# Patient Record
Sex: Female | Born: 1960 | Race: White | Hispanic: No | Marital: Married | Smoking: Former smoker
Health system: Southern US, Community
[De-identification: ages and names within clinical notes are randomized; demographics above are authoritative.]

## PROBLEM LIST (undated history)

## (undated) DIAGNOSIS — J449 Chronic obstructive pulmonary disease, unspecified: Secondary | ICD-10-CM

---

## 1999-09-28 ENCOUNTER — Other Ambulatory Visit: Admission: RE | Admit: 1999-09-28 | Discharge: 1999-09-28 | Payer: Self-pay | Admitting: Obstetrics and Gynecology

## 2001-10-16 ENCOUNTER — Ambulatory Visit (HOSPITAL_COMMUNITY): Admission: RE | Admit: 2001-10-16 | Discharge: 2001-10-16 | Payer: Self-pay | Admitting: Obstetrics and Gynecology

## 2001-10-16 ENCOUNTER — Encounter: Payer: Self-pay | Admitting: Obstetrics and Gynecology

## 2001-11-05 ENCOUNTER — Other Ambulatory Visit: Admission: RE | Admit: 2001-11-05 | Discharge: 2001-11-05 | Payer: Self-pay | Admitting: Obstetrics and Gynecology

## 2002-10-26 ENCOUNTER — Encounter: Payer: Self-pay | Admitting: Obstetrics and Gynecology

## 2002-10-26 ENCOUNTER — Ambulatory Visit (HOSPITAL_COMMUNITY): Admission: RE | Admit: 2002-10-26 | Discharge: 2002-10-26 | Payer: Self-pay | Admitting: Obstetrics and Gynecology

## 2002-12-21 ENCOUNTER — Other Ambulatory Visit: Admission: RE | Admit: 2002-12-21 | Discharge: 2002-12-21 | Payer: Self-pay | Admitting: Obstetrics and Gynecology

## 2003-08-18 ENCOUNTER — Ambulatory Visit (HOSPITAL_BASED_OUTPATIENT_CLINIC_OR_DEPARTMENT_OTHER): Admission: RE | Admit: 2003-08-18 | Discharge: 2003-08-18 | Payer: Self-pay | Admitting: Orthopedic Surgery

## 2004-02-02 ENCOUNTER — Other Ambulatory Visit: Admission: RE | Admit: 2004-02-02 | Discharge: 2004-02-02 | Payer: Self-pay | Admitting: Obstetrics and Gynecology

## 2004-06-08 ENCOUNTER — Other Ambulatory Visit: Payer: Self-pay

## 2011-01-06 ENCOUNTER — Encounter: Payer: Self-pay | Admitting: Obstetrics and Gynecology

## 2011-01-07 ENCOUNTER — Encounter: Payer: Self-pay | Admitting: Obstetrics and Gynecology

## 2011-05-04 NOTE — Op Note (Signed)
NAME:  Vickie Davis, Vickie Davis                            ACCOUNT NO.:  [id number]   MEDICAL RECORD NO.:  [id number]                   PATIENT TYPE:  AMB   LOCATION:  DSC                                  FACILITY:  [facility]   PHYSICIAN:  Harvie Junior, M.D.                DATE OF BIRTH:  01-11-1961   DATE OF PROCEDURE:  08/18/2003  DATE OF DISCHARGE:  08/18/2003                                 OPERATIVE REPORT   PREOPERATIVE DIAGNOSIS:  1. Carpal tunnel syndrome, right.  2. Volar radial ganglion cyst, right.   POSTOPERATIVE DIAGNOSIS:  1. Carpal tunnel syndrome, right.  2. Volar radial ganglion cyst, right.   PROCEDURE:  1. Right carpal tunnel release.  2. Excision of volar ganglion cyst, right.   SURGEON:  Harvie Junior, M.D.   ASSISTANT:  Marshia Ly, P.A.   ANESTHESIA:  Bier block.   INDICATIONS FOR PROCEDURE:  This is a 50 year old female with a long history  of having carpal tunnel symptoms as well as having a volar ganglion cyst.  We ultimately treated her conservatively, but failed.  Because of failure of  conservative care, she was taken to the operating room for excision of cyst  and release of carpal tunnel.   DESCRIPTION OF PROCEDURE:  The patient was taken to the operating room and  after adequate anesthesia was obtained with general anesthesia with a  forearm-based IV regional, the patient was placed upon the operating table.  The right arm was prepped and draped in the usual sterile fashion.  Following this, a curved incision was made over the volar aspect of the  wrist in the area of her ganglion cyst.  Subcutaneous tissue was dissected  down to the level of the cyst. The cyst was clearly identified on all sides  and dissected down to its origination at the volar wrist capsule in the  scapholunate interval.  The cyst was debrided from the volar wrist capsule  at this point.  The wound was copiously irrigated and suctioned dry, and  closed with interrupted nylon  sutures.  Following this, attention was turned  to a separate incision to carpal tunnel where a curved incision just ulnar  to the midline wrist crease.  Subcutaneous tissue was dissected down to the  level where the volar carpal ligament was clearly identified and divided  gently.  A Freer elevator was used to protect the median nerve below and the  carpal tunnel was released both proximally and distally from this point.  A  gloved finger was placed in the wound proximally and distally, the median  nerve was identified as well as the motor branch and found to be freed up.  At this point the wound was  copiously irrigated and suctioned dry. Closed with interrupted nylon  sutures.  A sterile compressive dressing was applied as well as volar wrist  plaster.  The patient was  taken to the recovery room where she was noted to  be satisfactory condition.  Estimated blood loss for the procedure was none.                                               Harvie Junior, M.D.    Ranae Plumber  D:  11/23/2003  T:  11/23/2003  Job:  [id number]

## 2012-01-31 ENCOUNTER — Other Ambulatory Visit: Payer: Self-pay | Admitting: Obstetrics and Gynecology

## 2012-01-31 DIAGNOSIS — Z1231 Encounter for screening mammogram for malignant neoplasm of breast: Secondary | ICD-10-CM

## 2012-02-19 ENCOUNTER — Ambulatory Visit
Admission: RE | Admit: 2012-02-19 | Discharge: 2012-02-19 | Disposition: A | Discharge status: Home / Self Care | Payer: BC Managed Care – PPO | Source: Ambulatory Visit | Attending: Obstetrics and Gynecology | Admitting: Obstetrics and Gynecology

## 2012-02-19 DIAGNOSIS — Z1231 Encounter for screening mammogram for malignant neoplasm of breast: Secondary | ICD-10-CM

## 2012-03-10 DIAGNOSIS — Z9109 Other allergy status, other than to drugs and biological substances: Secondary | ICD-10-CM | POA: Insufficient documentation

## 2012-03-22 ENCOUNTER — Inpatient Hospital Stay: Payer: Self-pay | Admitting: *Deleted

## 2012-03-22 LAB — COMPREHENSIVE METABOLIC PANEL
Albumin: 3.9 g/dL (ref 3.4–5.0)
Alkaline Phosphatase: 99 U/L (ref 50–136)
Anion Gap: 8 (ref 7–16)
BUN: 12 mg/dL (ref 7–18)
Bilirubin,Total: 0.4 mg/dL (ref 0.2–1.0)
Calcium, Total: 8.6 mg/dL (ref 8.5–10.1)
Chloride: 106 mmol/L (ref 98–107)
Co2: 26 mmol/L (ref 21–32)
Creatinine: 0.81 mg/dL (ref 0.60–1.30)
EGFR (African American): >60
EGFR (Non-African Amer.): >60
Glucose: 157 mg/dL — ABNORMAL HIGH (ref 65–99)
Osmolality: 282 (ref 275–301)
Potassium: 3.6 mmol/L (ref 3.5–5.1)
SGOT(AST): 33 U/L (ref 15–37)
SGPT (ALT): 30 U/L
Sodium: 140 mmol/L (ref 136–145)
Total Protein: 7.4 g/dL (ref 6.4–8.2)

## 2012-03-22 LAB — CBC
HCT: 39.4 % (ref 35.0–47.0)
HGB: 13.7 g/dL (ref 12.0–16.0)
MCH: 32.7 pg (ref 26.0–34.0)
MCV: 94 fL (ref 80–100)
Platelet: 181 10*3/uL (ref 150–440)
RBC: 4.2 10*6/uL (ref 3.80–5.20)

## 2012-03-25 LAB — HEMOGLOBIN A1C

## 2012-03-27 LAB — CULTURE, BLOOD (SINGLE)

## 2012-09-01 DIAGNOSIS — J441 Chronic obstructive pulmonary disease with (acute) exacerbation: Secondary | ICD-10-CM | POA: Insufficient documentation

## 2012-09-01 DIAGNOSIS — F17200 Nicotine dependence, unspecified, uncomplicated: Secondary | ICD-10-CM | POA: Insufficient documentation

## 2013-06-20 IMAGING — CR DG CHEST 1V PORT
1 series · 1 of 1 positions shown · non-contrast
Comparison: none

REASON FOR EXAM: SOB
COMMENTS:

PROCEDURE:     DXR - DXR PORTABLE CHEST SINGLE VIEW  - March 21, 2012 [DATE]
RESULT:     Comparison: None.

[portable]
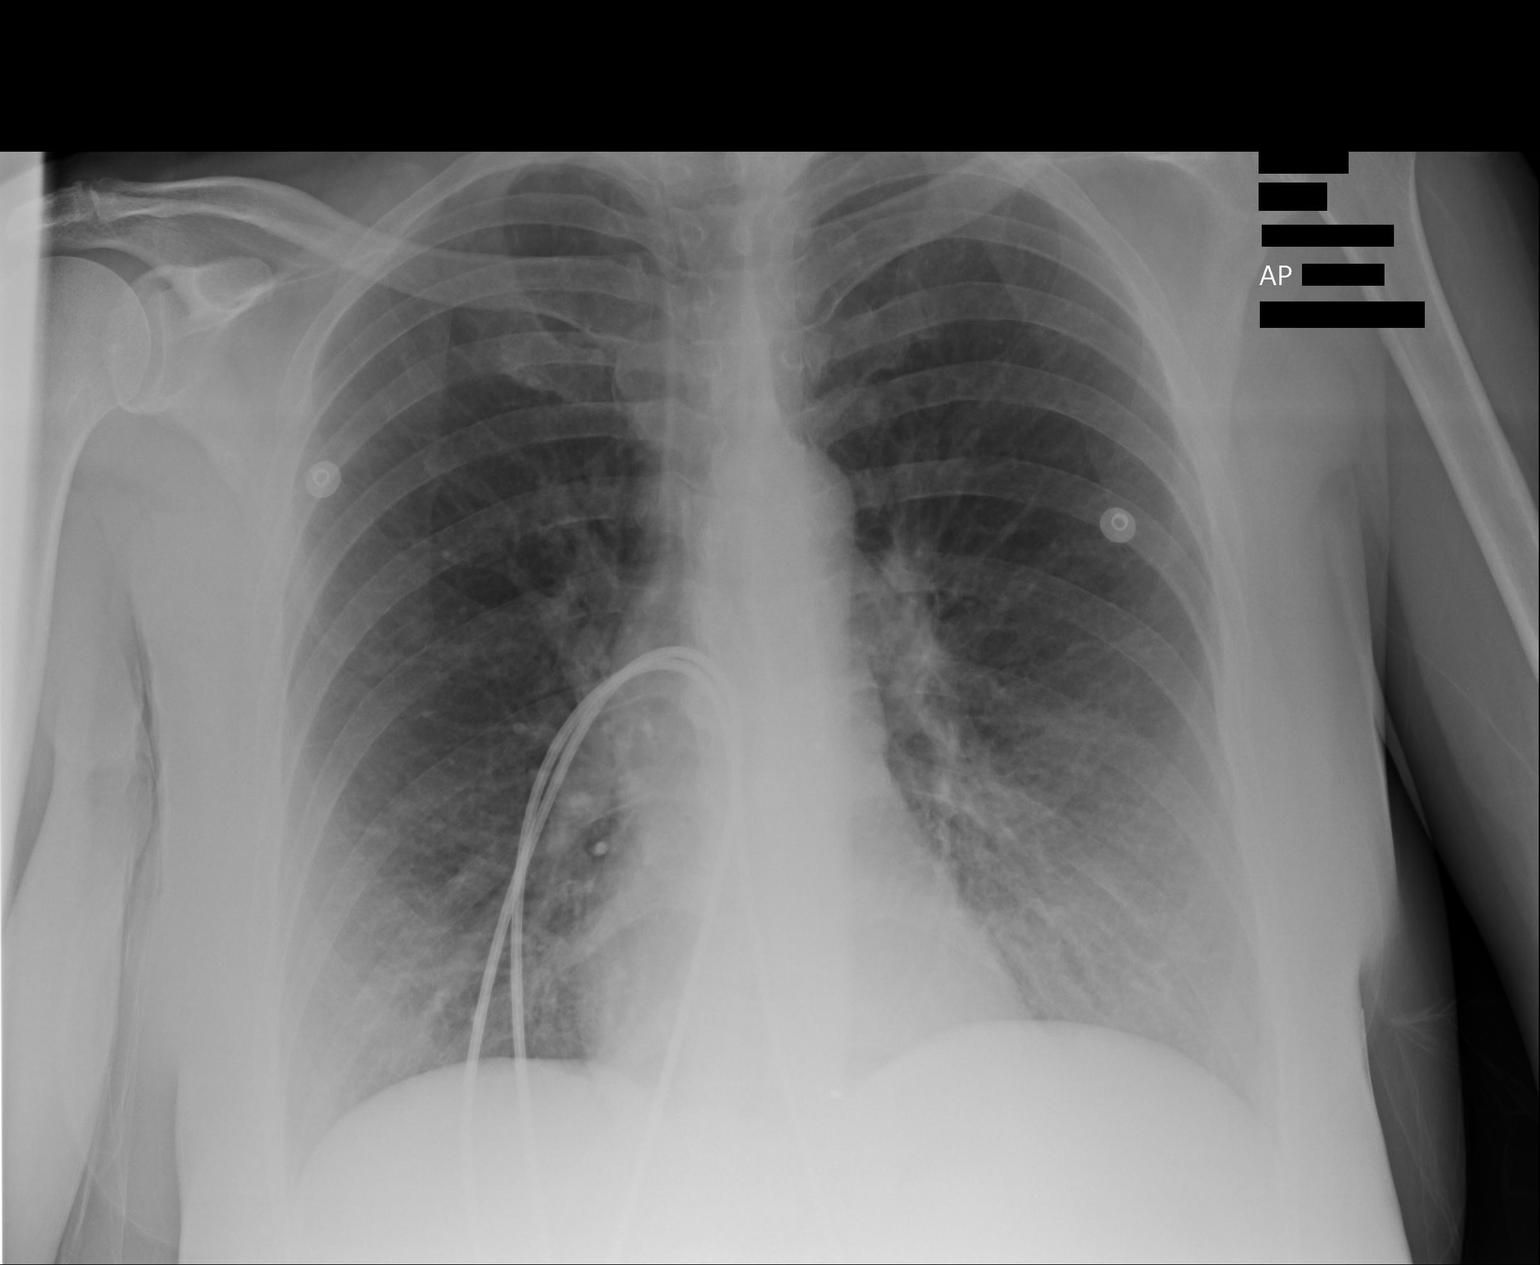

[1 of 1 positions shown; findings below may reference images not displayed]

FINDINGS: The heart and mediastinum are within normal limits. There are mild
interstitial opacities in the mid and lower lungs bilaterally. There is a
small nodular density at the periphery of the right upper lung, overlying
the right posterior lateral fifth rib. This is likely artifactual.
IMPRESSION: 1. Mild interstitial opacities in the mid and lower lungs may represent
interstitial pulmonary edema or atypical infection. Followup PA and lateral
chest radiographs are recommended.
2. Small nodular density at the periphery right upper lung is likely
artifactual. However, attention is recommended on the followup chest
radiograph.

## 2014-06-14 DIAGNOSIS — M5030 Other cervical disc degeneration, unspecified cervical region: Secondary | ICD-10-CM | POA: Insufficient documentation

## 2015-04-10 NOTE — H&P (Signed)
PATIENT NAME:  Vickie Davis, Vickie Davis MR#:  [id number] DATE OF BIRTH:  19-Nov-1961  DATE OF ADMISSION:  03/22/2012  PRIMARY CARE PHYSICIAN: Dr. Kathrynn Running in [place].   CHIEF COMPLAINT: Increased shortness of breath, wheezing, cough, and fever on and off.   HISTORY OF PRESENT ILLNESS: Ms. Vickie Davis is a 54 year old Caucasian female with history of chronic smoking, evidently underlying chronic obstructive pulmonary disease. The patient reported that over the last week or more she has cough with sputum production; however, over the last couple of days her symptoms worsened accompanied by increased shortness of breath and wheezing. Fever on and off and chills. She reports feeling hot and cold as well. Today, her symptoms progressed to the extent that she could not breathe. She called EMS. She received several breathing treatments by EMS and transported to the emergency department. Her oxygen saturation found to be in the mid 80s. She received several other treatments here with only mild to moderate improvement. The patient was admitted for further evaluation and management. Her chest x-ray did not show pneumonia. The patient indicates that every year when the allergy season starts around this time she will get a runny nose, stuffy nose and wheezing.   REVIEW OF SYSTEMS. CONSTITUTIONAL: Reports fever on and off and chills. Progressive fatigue and sweating. EYES: No blurring of vision. No double vision. ENT: No hearing impairment. No sore throat. No dysphagia. CARDIOVASCULAR: No chest pain. Admits having shortness of breath and wheezing. No syncope. No edema.   RESPIRATORY: She has cough and a little sputum production. No chest pain. No hemoptysis.   GASTROINTESTINAL: No abdominal pain, no vomiting, no diarrhea.   GENITOURINARY: No dysuria or frequency of urination.   MUSCULOSKELETAL: No joint pain or swelling. No muscular pain or swelling.   INTEGUMENTARY: No skin rash. No ulcers.   NEUROLOGY: No focal weakness. No  seizure activity. No headache, only mild transient headache when she has cough. PSYCHIATRY: No anxiety or depression. ENDOCRINE: No polyuria or polydipsia. No heat or cold intolerance.   PAST MEDICAL HISTORY:  History of chronic smoking, history of allergic rhinitis.   PAST SURGICAL HISTORY:  History of feet and toes surgeries for spur formation and bunion surgery. She has had 3 back surgeries. Carpal tunnel surgery. Hysterectomy and C-section.   FAMILY HISTORY: Her mother suffered from emphysema.   SOCIAL HISTORY: She is married, living with her husband. She has five children. She works at a [employer]. Social habits: Chronic smoker 1/2 pack to 1 pack per day since age of 66. No history of alcohol or drug abuse.   ADMISSION MEDICATIONS:  1. ProAir HFA inhalations p.r.n.  2. Tramadol 50 mg 2 tablets twice a day p.r.n.  3. Flexeril 10 mg 3 times a day p.r.n.  4. Diclofenac 75 mg twice a day, started yesterday by her orthopedic surgeon.  5. Hycodan 5 mL q.6 hours p.r.n. for cough.   6. Azelastine nasal spray twice a day.   7. Levaquin 500 mg tablet once a day.  8. She just finished Zithromax recently.   ALLERGIES: Sulfa causes skin rash.   PHYSICAL EXAMINATION:  VITAL SIGNS: Blood pressure 144/80, respiratory rate 32, pulse was 80 per minute, temperature 98.9, oxygen saturation on oxygen was 93%.   GENERAL APPEARANCE: Middle-aged female lying in bed in no acute distress.   HEAD AND NECK EXAMINATION: No pallor. No icterus. No cyanosis. ENT examination: Hearing was normal. Nasal mucosa, lips, tongue were normal. The tongue was coated with whitish material, but it  is not thrush.  Eye examination revealed normal eyelids and conjunctivae. Pupils about 5 mm, equal and reactive to light.   NECK: Supple. Trachea at midline. No thyromegaly. No cervical lymphadenopathy. No masses.   HEART: Normal S1, S2. No S3, S4. No murmur. No gallop. No carotid bruits.   RESPIRATORY: Normal  breathing pattern without using accessory muscles at time of my examination. She has bilateral rhonchi and diffuse wheezing and prolonged expiratory phase. No rales.   ABDOMEN: Soft without tenderness. No hepatosplenomegaly. No masses. No hernias.   SKIN: No ulcers. No subcutaneous nodules.   MUSCULOSKELETAL: No joint swelling. No clubbing.   NEUROLOGIC: Cranial nerves II through XII are intact. No focal motor deficit.   PSYCHIATRIC: The patient is alert, oriented x3. Mood and affect were normal.   LABORATORY FINDINGS: Chest x-ray showed normal heart size. No consolidation. No effusion. There are increased pulmonary markings. Her CBC showed white count of 4500, hemoglobin 13, hematocrit 39, platelet count 191,000. Liver function tests were normal. Serum sodium was 140, potassium 3.6, BUN 12, creatinine 0.8. Glucose was elevated at 157.   ASSESSMENT:  1. Chronic obstructive pulmonary disease exacerbation.  2. Allergic rhinitis.  3. Tobacco abuse.  4. Chronic back pain.   PLAN: The patient will be admitted to the medical floor. Start IV antibiotic using Levaquin.  DuoNeb q.4 hours. IV Solu-Medrol which will be necessary for her chronic obstructive pulmonary disease exacerbation and also allergic rhinitis. Zyrtec 10 mg once a day. Continue antihistamine nasal spray. Hycodan for cough suppression. Oxygen supplementation. I offered nicotine patch but patient reported that she reacted to that in the past and did not like it in terms of causing headache for her. I will hold diclofenac. I would like to discontinue nonsteroidal anti-inflammatory medication in the setting of bronchospasm and allergic rhinitis. I will also hold tramadol or Ultram since there is interaction with the other medications. For her back pain, I placed her on hydrocodone p.r.n. Since her blood sugar is elevated we will place her on Accu-Cheks and sliding scale since steroid is initiated as well.   TIME SPENT ON THIS PATIENT:  More than 50 minutes.    ____________________________ Carney CornersAmir M. Rudene Rearwish, MD amd:vtd D: 03/22/2012 01:08:42 ET T: 03/22/2012 07:50:06 ET JOB#: [id number]  cc: Carney CornersAmir M. Rudene Rearwish, MD, <Dictator> [place] [place] Virlee Stroschein Judie PetitM Snigdha Howser MD ELECTRONICALLY SIGNED 03/23/2012 6:54

## 2015-04-10 NOTE — Discharge Summary (Signed)
PATIENT NAME:  Vickie Davis, Vickie Davis MR#:  [id number] DATE OF BIRTH:  June 21, 1961  DATE OF ADMISSION:  03/22/2012 DATE OF DISCHARGE:  03/24/2012  DISCHARGE DIAGNOSES:  1. Acute respiratory failure.  2. Chronic obstructive pulmonary disease exacerbation.  3. Interstitial pneumonia.  4. Tobacco abuse.   HOSPITAL COURSE:  54 year old female smoker with most likely history of chronic obstructive pulmonary disease presented with increased shortness of breath, wheezing, cough, and fever. She was admitted as acute respiratory failure/ chronic obstructive pulmonary disease exacerbation. She was started on IV Solu-Medrol. She was on Levaquin at home, which was continued. She was continued on DuoNebs. At admission her white count was normal at 4.5, hemoglobin 13.7. Her creatinine was normal at 0.8. LFTs were normal. Blood cultures are negative. Chest x-ray at admission showed mild interstitial opacities in the mid and lower lungs, may represent edema versus atypical infection, and small nodular density at the periphery of the  right lung, artifactual. Her shortness of breath has significantly improved on steroids and antibiotics. Her chest is clear right now. No wheezing. She is ambulating around, not in any distress. We also checked her at the time of discharge whether she qualifies for home oxygen. She is saturating 90% at rest and with ambulation, which does not qualify for home oxygen. She is on Levaquin here and she has Levaquin at home so I will complete her course of Levaquin that  she has. We will give her a prednisone taper. I will give her Advair and Spiriva and continue ProAir as needed. I will stop her diclofenac because she is on steroids and I would not like to give her NSAIDs with steroids. Her sugar was slightly elevated on steroids, but hemoglobin A1c is 5.7. She is not diabetic. I advised her smoking cessation and I am going to set her up with pulmonology as an outpatient to do PFTs and further followup of her  chronic obstructive pulmonary disease.   DISCHARGE MEDICATIONS: 1. Levaquin 500 mg daily, her home course. 2. Tramadol 50 mg twice a day as needed.  3. Hycodan 5 mL 4 times a day as needed. 4. Azelastine nasal spray, two sprays twice a day. 5. ProAir 2 puffs q. 4-6 hours as needed.  6. Cyclobenzaprine 10 mg 3 times a day as needed.   NEW MEDICATIONS:  1. Complete course of Levaquin.  2. Prednisone taper as directed.  3. Advair Diskus 250/50, 1 puff b.i.d. 4. Spiriva HandiHaler 1 puff once daily.   NOTE: Do not take diclofenac.   DIET: Regular.  CONDITION AT DISCHARGE: Comfortable.  T-max of 97.8, heart rate 101, blood pressure 122/78, saturating 90% on room air. Chest is clear. Heart sounds are regular. Abdomen soft, nontender.  FOLLOWUP: 1. Follow up with the primary care physician in one week.  2. Follow up with [place]. [place]. 3. Please make an appointment with pulmonology, Dr. Meredeth IdeFleming, in two weeks.  4. The patient will need a follow-up chest x-ray in a few weeks to see resolution of pneumonia.   TIME SPENT ON DISCHARGE: 50 minutes.  ____________________________ Fredia SorrowAbhinav Jhayden Demuro, MD ag:bjt D: 03/24/2012 15:31:06 ET T: 03/25/2012 12:11:20 ET JOB#: [id number]  cc: Fredia SorrowAbhinav Anelly Samarin, MD, <Dictator> Herbon E. Meredeth IdeFleming, MD Dr. Kathrynn RunningManning, [place] Keoni Havey MD ELECTRONICALLY SIGNED 03/31/2012 13:41

## 2017-12-08 DIAGNOSIS — R778 Other specified abnormalities of plasma proteins: Secondary | ICD-10-CM | POA: Insufficient documentation

## 2017-12-09 DIAGNOSIS — S2690XA Unspecified injury of heart, unspecified with or without hemopericardium, initial encounter: Secondary | ICD-10-CM | POA: Insufficient documentation

## 2017-12-09 DIAGNOSIS — I5A Non-ischemic myocardial injury (non-traumatic): Secondary | ICD-10-CM | POA: Insufficient documentation

## 2017-12-11 DIAGNOSIS — J9621 Acute and chronic respiratory failure with hypoxia: Secondary | ICD-10-CM | POA: Insufficient documentation

## 2019-06-30 ENCOUNTER — Other Ambulatory Visit: Payer: Self-pay

## 2019-06-30 ENCOUNTER — Encounter: Payer: Self-pay | Admitting: Neurology

## 2019-06-30 DIAGNOSIS — G629 Polyneuropathy, unspecified: Secondary | ICD-10-CM

## 2019-07-30 ENCOUNTER — Encounter: Payer: Self-pay | Admitting: Neurology

## 2019-09-17 ENCOUNTER — Other Ambulatory Visit: Payer: Self-pay

## 2019-09-17 ENCOUNTER — Ambulatory Visit (INDEPENDENT_AMBULATORY_CARE_PROVIDER_SITE_OTHER): Payer: BLUE CROSS/BLUE SHIELD | Admitting: Neurology

## 2019-09-17 DIAGNOSIS — G629 Polyneuropathy, unspecified: Secondary | ICD-10-CM

## 2019-09-17 DIAGNOSIS — M5417 Radiculopathy, lumbosacral region: Secondary | ICD-10-CM

## 2019-09-17 NOTE — Procedures (Addendum)
[facility]  [address] [place], [address]  [place], [place] [address] Tel: [phone] Fax:  [phone] Test Date:  09/17/2019  Patient: Vickie Davis DOB: 04-18-1961 Physician: Nita Sickle, DO  Sex: Female Height: 5\' 6"  Ref Phys: , MD  ID#: [id number] Sprout Temp: 33.0C Technician:    Patient Complaints: This is a 58 year old female referred for evaluation of bilateral leg pain and numbness.  NCV & EMG Findings: Extensive electrodiagnostic testing of the right lower extremity and additional studies of the left shows: 1. Bilateral sural and superficial peroneal sensory responses are within normal limits. 2. Bilateral tibial and peroneal motor responses are within normal limits. 3. Bilateral tibial H reflex studies are within normal limits. 4. Chronic motor axonal loss changes are seen affecting the L2-3 myotomes bilaterally, without accompanied active denervation  Impression: 1. 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 2. There is no evidence of a sensorimotor polyneuropathy affecting the lower extremities.   ___________________________ 65, DO    Nerve Conduction Studies Anti Sensory Summary Table   Site NR Peak (ms) Norm Peak (ms) P-T Amp (V) Norm P-T Amp  Left Sup Peroneal Anti Sensory (Ant Lat Mall)  33C  12 cm    2.7 <4.6 7.0 >3  Right Sup Peroneal Anti Sensory (Ant Lat Mall)  33C  12 cm    2.5 <4.6 8.2 >3  Left Sural Anti Sensory (Lat Mall)  33C  Calf    3.3 <4.6 5.8 >3  Right Sural Anti Sensory (Lat Mall)  33C  Calf    3.1 <4.6 9.8 >3   Motor Summary Table   Site NR Onset (ms) Norm Onset (ms) O-P Amp (mV) Norm O-P Amp Site1 Site2 Delta-0 (ms) Dist (cm) Vel (m/s) Norm Vel (m/s)  Left Peroneal Motor (Ext Dig Brev)  33C  Ankle    5.5 <6.0 2.8 >2.5 B Fib Ankle 8.2 37.0 45 >40  B Fib    13.7  2.8  Poplt B Fib 1.5 8.0 53 >40  Poplt    15.2  2.7         Right Peroneal Motor (Ext Dig Brev)  33C  Ankle     5.6 <6.0 2.7 >2.5 B Fib Ankle 6.0 36.0 60 >40  B Fib    11.6  2.0  Poplt B Fib 1.5 8.0 53 >40  Poplt    13.1  1.9         Left Peroneal TA Motor (Tib Ant)  33C  Fib Head    2.5 <4.5 5.2 >3 Poplit Fib Head 1.3 8.0 62 >40  Poplit    3.8  5.0         Right Peroneal TA Motor (Tib Ant)  33C  Fib Head    2.3 <4.5 5.3 >3 Poplit Fib Head 1.2 8.0 67 >40  Poplit    3.5  5.1         Left Tibial Motor (Abd Hall Brev)  33C  Ankle    4.4 <6.0 16.5 >4 Knee Ankle 9.0 44.0 49 >40  Knee    13.4  11.6         Right Tibial Motor (Abd Hall Brev)  33C  Ankle    4.0 <6.0 11.3 >4 Knee Ankle 8.1 43.0 53 >40  Knee    12.1  6.4          H Reflex Studies   NR H-Lat (ms) Lat Norm (ms) L-R H-Lat (ms)  Left Tibial (Gastroc)  33C     32.11 <35 0.00  Right Tibial (Gastroc)  33C     32.11 <35 0.00   EMG   Side Muscle Ins Act Fibs Psw Fasc Number Recrt Dur Dur. Amp Amp. Poly Poly. Comment  Right AntTibialis Nml Nml Nml Nml Nml Nml Nml Nml Nml Nml Nml Nml N/A  Right Gastroc Nml Nml Nml Nml Nml Nml Nml Nml Nml Nml Nml Nml N/A  Right Flex Dig Long Nml Nml Nml Nml Nml Nml Nml Nml Nml Nml Nml Nml N/A  Right RectFemoris Nml Nml Nml Nml 1- Rapid Some 1+ Some 1+ Nml Nml N/A  Right GluteusMed Nml Nml Nml Nml Nml Nml Nml Nml Nml Nml Nml Nml N/A  Right AdductorLong Nml Nml Nml Nml 1- Rapid Some 1+ Some 1+ Nml Nml N/A  Left Gastroc Nml Nml Nml Nml Nml Nml Nml Nml Nml Nml Nml Nml N/A  Left Flex Dig Long Nml Nml Nml Nml Nml Nml Nml Nml Nml Nml Nml Nml N/A  Left RectFemoris Nml Nml Nml Nml 1- Rapid Some 1+ Some 1+ Nml Nml N/A  Left GluteusMed Nml Nml Nml Nml Nml Nml Nml Nml Nml Nml Nml Nml N/A  Left AntTibialis Nml Nml Nml Nml Nml Nml Nml Nml Nml Nml Nml Nml N/A      Waveforms:

## 2019-09-23 ENCOUNTER — Telehealth: Payer: Self-pay | Admitting: Neurology

## 2019-09-23 NOTE — Telephone Encounter (Signed)
Patient was wanting the EMG results. Thanks!

## 2019-09-24 NOTE — Telephone Encounter (Signed)
Please inform her that the results were faxed to Dr. Rupert Stacks - the doctor that ordered the study.  She should contact their office.

## 2019-09-24 NOTE — Telephone Encounter (Signed)
Called back spoke with patient she was informed of results.

## 2019-09-24 NOTE — Telephone Encounter (Signed)
Called patient no answer  Phone just rings will try back later

## 2022-12-05 ENCOUNTER — Other Ambulatory Visit: Payer: Self-pay

## 2022-12-05 ENCOUNTER — Inpatient Hospital Stay
Admission: EM | Admit: 2022-12-05 | Discharge: 2022-12-09 | DRG: 871 | Disposition: A | Discharge status: Home / Self Care | Payer: Medicare Other | Attending: Hospitalist | Admitting: Hospitalist

## 2022-12-05 ENCOUNTER — Emergency Department: Payer: Medicare Other

## 2022-12-05 DIAGNOSIS — Z9981 Dependence on supplemental oxygen: Secondary | ICD-10-CM

## 2022-12-05 DIAGNOSIS — R6520 Severe sepsis without septic shock: Secondary | ICD-10-CM | POA: Diagnosis not present

## 2022-12-05 DIAGNOSIS — Z91012 Allergy to eggs: Secondary | ICD-10-CM

## 2022-12-05 DIAGNOSIS — A4189 Other specified sepsis: Principal | ICD-10-CM | POA: Diagnosis present

## 2022-12-05 DIAGNOSIS — Z9109 Other allergy status, other than to drugs and biological substances: Secondary | ICD-10-CM | POA: Diagnosis not present

## 2022-12-05 DIAGNOSIS — Z7951 Long term (current) use of inhaled steroids: Secondary | ICD-10-CM

## 2022-12-05 DIAGNOSIS — J9621 Acute and chronic respiratory failure with hypoxia: Secondary | ICD-10-CM | POA: Insufficient documentation

## 2022-12-05 DIAGNOSIS — J9601 Acute respiratory failure with hypoxia: Secondary | ICD-10-CM | POA: Insufficient documentation

## 2022-12-05 DIAGNOSIS — Z1152 Encounter for screening for COVID-19: Secondary | ICD-10-CM

## 2022-12-05 DIAGNOSIS — J441 Chronic obstructive pulmonary disease with (acute) exacerbation: Secondary | ICD-10-CM | POA: Diagnosis present

## 2022-12-05 DIAGNOSIS — J810 Acute pulmonary edema: Secondary | ICD-10-CM | POA: Diagnosis present

## 2022-12-05 DIAGNOSIS — A419 Sepsis, unspecified organism: Secondary | ICD-10-CM

## 2022-12-05 DIAGNOSIS — Z882 Allergy status to sulfonamides status: Secondary | ICD-10-CM

## 2022-12-05 DIAGNOSIS — J101 Influenza due to other identified influenza virus with other respiratory manifestations: Secondary | ICD-10-CM | POA: Diagnosis not present

## 2022-12-05 DIAGNOSIS — Z79899 Other long term (current) drug therapy: Secondary | ICD-10-CM

## 2022-12-05 LAB — COMPREHENSIVE METABOLIC PANEL
ALT: 15 U/L (ref 0–44)
AST: 27 U/L (ref 15–41)
Albumin: 3.6 g/dL (ref 3.5–5.0)
Alkaline Phosphatase: 96 U/L (ref 38–126)
Anion gap: 15 (ref 5–15)
BUN: 8 mg/dL (ref 8–23)
CO2: 22 mmol/L (ref 22–32)
Calcium: 8.8 mg/dL — ABNORMAL LOW (ref 8.9–10.3)
Chloride: 96 mmol/L — ABNORMAL LOW (ref 98–111)
Creatinine, Ser: 0.87 mg/dL (ref 0.44–1.00)
GFR, Estimated: >60 mL/min (ref >60)
Glucose, Bld: 171 mg/dL — ABNORMAL HIGH (ref 70–99)
Potassium: 3.6 mmol/L (ref 3.5–5.1)
Sodium: 133 mmol/L — ABNORMAL LOW (ref 135–145)
Total Bilirubin: 1.6 mg/dL — ABNORMAL HIGH (ref 0.3–1.2)
Total Protein: 8.1 g/dL (ref 6.5–8.1)

## 2022-12-05 LAB — APTT: aPTT: 28 seconds (ref 24–36)

## 2022-12-05 LAB — CBC WITH DIFFERENTIAL/PLATELET
Abs Immature Granulocytes: 0.08 10*3/uL — ABNORMAL HIGH (ref 0.00–0.07)
Basophils Absolute: 0 10*3/uL (ref 0.0–0.1)
Basophils Relative: 0 %
Eosinophils Absolute: 0 10*3/uL (ref 0.0–0.5)
Eosinophils Relative: 0 %
HCT: 36.7 % (ref 36.0–46.0)
Hemoglobin: 12.4 g/dL (ref 12.0–15.0)
Immature Granulocytes: 1 %
Lymphocytes Relative: 5 %
Lymphs Abs: 0.6 10*3/uL — ABNORMAL LOW (ref 0.7–4.0)
MCH: 30.2 pg (ref 26.0–34.0)
MCHC: 33.8 g/dL (ref 30.0–36.0)
MCV: 89.3 fL (ref 80.0–100.0)
Monocytes Absolute: 0.8 10*3/uL (ref 0.1–1.0)
Monocytes Relative: 7 %
Neutro Abs: 10.8 10*3/uL — ABNORMAL HIGH (ref 1.7–7.7)
Neutrophils Relative %: 87 %
Platelets: 352 10*3/uL (ref 150–400)
RBC: 4.11 MIL/uL (ref 3.87–5.11)
RDW: 12.6 % (ref 11.5–15.5)
WBC: 12.3 10*3/uL — ABNORMAL HIGH (ref 4.0–10.5)
nRBC: 0 % (ref 0.0–0.2)

## 2022-12-05 LAB — URINALYSIS, COMPLETE (UACMP) WITH MICROSCOPIC
Bacteria, UA: NONE SEEN
Bilirubin Urine: NEGATIVE
Glucose, UA: NEGATIVE mg/dL
Ketones, ur: 20 mg/dL — AB
Leukocytes,Ua: NEGATIVE
Nitrite: NEGATIVE
Protein, ur: NEGATIVE mg/dL
Specific Gravity, Urine: 1.005 (ref 1.005–1.030)
pH: 6 (ref 5.0–8.0)

## 2022-12-05 LAB — RESP PANEL BY RT-PCR (RSV, FLU A&B, COVID)  RVPGX2
Influenza A by PCR: POSITIVE — AB
Influenza B by PCR: NEGATIVE
Resp Syncytial Virus by PCR: NEGATIVE
SARS Coronavirus 2 by RT PCR: NEGATIVE

## 2022-12-05 LAB — LACTIC ACID, PLASMA: Lactic Acid, Venous: 1.7 mmol/L (ref 0.5–1.9)

## 2022-12-05 LAB — BLOOD GAS, VENOUS
Acid-Base Excess: 1 mmol/L (ref 0.0–2.0)
Bicarbonate: 27.1 mmol/L (ref 20.0–28.0)
Delivery systems: POSITIVE
O2 Saturation: 52.6 %
Patient temperature: 37
pCO2, Ven: 48 mmHg (ref 44–60)
pH, Ven: 7.36 (ref 7.25–7.43)
pO2, Ven: 38 mmHg (ref 32–45)

## 2022-12-05 LAB — PROTIME-INR
INR: 1.1 (ref 0.8–1.2)
Prothrombin Time: 14.4 seconds (ref 11.4–15.2)

## 2022-12-05 MED ORDER — IPRATROPIUM-ALBUTEROL 0.5-2.5 (3) MG/3ML IN SOLN
3.0000 mL | Freq: Four times a day (QID) | RESPIRATORY_TRACT | Status: DC
  Administered 2022-12-05 – 2022-12-07 (×5): 3 mL via RESPIRATORY_TRACT
  Filled 2022-12-05 (×6): qty 3

## 2022-12-05 MED ORDER — HYDROCODONE-ACETAMINOPHEN 5-325 MG PO TABS: 1.0000 | ORAL_TABLET | ORAL | Status: DC | PRN

## 2022-12-05 MED ORDER — IPRATROPIUM-ALBUTEROL 0.5-2.5 (3) MG/3ML IN SOLN
RESPIRATORY_TRACT | Status: AC
  Administered 2022-12-05: 6 mL
  Filled 2022-12-05: qty 6

## 2022-12-05 MED ORDER — ACETAMINOPHEN 325 MG PO TABS
650.0000 mg | ORAL_TABLET | Freq: Four times a day (QID) | ORAL | Status: DC | PRN
  Administered 2022-12-07 – 2022-12-09 (×2): 650 mg via ORAL
  Filled 2022-12-05 (×2): qty 2

## 2022-12-05 MED ORDER — LACTATED RINGERS IV SOLN: INTRAVENOUS | Status: DC

## 2022-12-05 MED ORDER — ONDANSETRON HCL 4 MG/2ML IJ SOLN: 4.0000 mg | Freq: Four times a day (QID) | INTRAMUSCULAR | Status: DC | PRN

## 2022-12-05 MED ORDER — ACETAMINOPHEN 650 MG RE SUPP: 650.0000 mg | Freq: Four times a day (QID) | RECTAL | Status: DC | PRN

## 2022-12-05 MED ORDER — SODIUM CHLORIDE 0.9 % IV SOLN
500.0000 mg | Freq: Once | INTRAVENOUS | Status: AC
  Administered 2022-12-05: 500 mg via INTRAVENOUS
  Filled 2022-12-05: qty 5

## 2022-12-05 MED ORDER — ONDANSETRON HCL 4 MG PO TABS: 4.0000 mg | ORAL_TABLET | Freq: Four times a day (QID) | ORAL | Status: DC | PRN

## 2022-12-05 MED ORDER — ACETAMINOPHEN 500 MG PO TABS
1000.0000 mg | ORAL_TABLET | Freq: Once | ORAL | Status: AC
  Administered 2022-12-05: 1000 mg via ORAL
  Filled 2022-12-05: qty 2

## 2022-12-05 MED ORDER — TRAZODONE HCL 100 MG PO TABS
100.0000 mg | ORAL_TABLET | Freq: Every day | ORAL | Status: DC
  Administered 2022-12-05 – 2022-12-06 (×2): 100 mg via ORAL
  Filled 2022-12-05 (×2): qty 1

## 2022-12-05 MED ORDER — LACTATED RINGERS IV BOLUS
1500.0000 mL | Freq: Once | INTRAVENOUS | Status: AC
  Administered 2022-12-05: 1500 mL via INTRAVENOUS

## 2022-12-05 MED ORDER — ENOXAPARIN SODIUM 60 MG/0.6ML IJ SOSY
0.5000 mg/kg | PREFILLED_SYRINGE | INTRAMUSCULAR | Status: DC
  Administered 2022-12-05 – 2022-12-08 (×4): 47.5 mg via SUBCUTANEOUS
  Filled 2022-12-05 (×4): qty 0.6

## 2022-12-05 MED ORDER — LACTATED RINGERS IV BOLUS: 1000.0000 mL | Freq: Once | INTRAVENOUS | Status: DC

## 2022-12-05 MED ORDER — SODIUM CHLORIDE 0.9 % IV SOLN
2.0000 g | Freq: Once | INTRAVENOUS | Status: AC
  Administered 2022-12-05: 2 g via INTRAVENOUS
  Filled 2022-12-05: qty 12.5

## 2022-12-05 MED ORDER — OSELTAMIVIR PHOSPHATE 75 MG PO CAPS
75.0000 mg | ORAL_CAPSULE | Freq: Once | ORAL | Status: AC
  Administered 2022-12-05: 75 mg via ORAL
  Filled 2022-12-05: qty 1

## 2022-12-05 MED ORDER — ALBUTEROL SULFATE (2.5 MG/3ML) 0.083% IN NEBU
2.5000 mg | INHALATION_SOLUTION | RESPIRATORY_TRACT | Status: DC | PRN
  Administered 2022-12-06 (×2): 2.5 mg via RESPIRATORY_TRACT
  Filled 2022-12-05 (×2): qty 3

## 2022-12-05 MED ORDER — ATORVASTATIN CALCIUM 20 MG PO TABS
40.0000 mg | ORAL_TABLET | Freq: Every day | ORAL | Status: DC
  Administered 2022-12-05 – 2022-12-09 (×5): 40 mg via ORAL
  Filled 2022-12-05 (×5): qty 2

## 2022-12-05 NOTE — Sepsis Progress Note (Signed)
Elink monitoring for the code sepsis protocol.  

## 2022-12-05 NOTE — H&P (Signed)
History and Physical    Patient: Vickie Davis DQQ:[id number] DOB: 06/01/1961 DOA: 12/05/2022 DOS: the patient was seen and examined on 12/05/2022 PCP: Patient, No Pcp Per  Patient coming from: Home  Chief Complaint:  Chief Complaint  Patient presents with   Respiratory Distress    HPI: Vickie Davis is a 61 y.o. female with medical history significant for COPD who presents to the ED by EMS for evaluation of cough and shortness of breath that began a week prior and progressively worsened.  Symptoms were not improved with home bronchodilator treatments.  She denies chest pain, fever or chills.  On arrival of EMS she was in acute respiratory distress with O2 sat 84% on room air requiring CPAP for transport.  She received DuoNebs, Solu-Medrol as well as Nitropaste and route. ED course and data review: Tmax 100.7, BP 133/54 with pulse 120, respirations 28, O2 sat 98% on BiPAP to which she was transitioned.  VBG unremarkable.  CBC with WBC 12,300, lactic acid pending.  CMP mostly unremarkable.  Influenza A PCR positive, COVID-negative. EKG, personally viewed and interpreted showing sinus tachycardia at 118 with mildly prolonged QT interval and no ischemic ST-T wave changes.   Chest x-ray showing increased interstitial markings suggesting CHF possible interstitial pneumonia as follows: IMPRESSION: Central pulmonary vessels are more prominent suggesting CHF. Increased interstitial markings are seen in mid and lower lung fields, more so on the left side suggesting interstitial edema or interstitial pneumonia.  Patient was treated with additional DuoNebs, initially started on cefepime and azithromycin and subsequently Tamiflu following the influenza A result.  By admission she was transitioned off BiPAP to nasal cannula which she appeared to be tolerating.  Hospitalist consulted for admission.   Review of Systems: As mentioned in the history of present illness. All other systems reviewed and are  negative.   Allergies  Allergen Reactions   Sulfa Antibiotics Hives   Eggs Or Egg-Derived Products Itching    No mayonnaise or salad dressings made with eggs either   Other     No family history on file.  Prior to Admission medications   Medication Sig Start Date End Date Taking? Authorizing Provider  atorvastatin (LIPITOR) 40 MG tablet Take 40 mg by mouth daily. 11/07/22  Yes [provider]  DULERA 200-5 MCG/ACT AERO Inhale 2 puffs into the lungs 2 (two) times daily. 11/01/22  Yes [provider]  fluticasone (FLONASE) 50 MCG/ACT nasal spray Place 2 sprays into both nostrils daily. 10/25/22  Yes [provider]  montelukast (SINGULAIR) 10 MG tablet Take 10 mg by mouth at bedtime.    [provider]  SPIRIVA RESPIMAT 2.5 MCG/ACT AERS Inhale 2 each into the lungs daily.    [provider]  traZODone (DESYREL) 100 MG tablet Take 100 mg by mouth at bedtime.    [provider]  VENTOLIN HFA 108 (90 Base) MCG/ACT inhaler Inhale 2 puffs into the lungs every 4 (four) hours as needed for wheezing or shortness of breath.    [provider]    Physical Exam: Vitals:   12/05/22 1809 12/05/22 1830 12/05/22 1900 12/05/22 1934  BP:  (!) 133/54 (!) 112/57   Pulse:  (!) 120 (!) 114   Resp:  (!) 28 (!) 32   Temp: (!) 100.7 F (38.2 C)     TempSrc: Axillary     SpO2: 96% 98% 97% 94%  Weight:      Height:       Physical Exam  Vitals and nursing note reviewed.  Constitutional:      General: She is sleeping. She is not in acute distress.    Appearance: She is ill-appearing.  HENT:     Head: Normocephalic and atraumatic.  Cardiovascular:     Rate and Rhythm: Regular rhythm. Tachycardia present.     Heart sounds: Normal heart sounds.  Pulmonary:     Effort: Pulmonary effort is normal. Tachypnea present.     Breath sounds: Normal breath sounds.  Abdominal:     Palpations: Abdomen is soft.     Tenderness: There is no abdominal  tenderness.  Neurological:     Mental Status: Mental status is at baseline. She is lethargic.     Labs on Admission: I have personally reviewed following labs and imaging studies  CBC: Recent Labs  Lab 12/05/22 1801  WBC 12.3*  NEUTROABS 10.8*  HGB 12.4  HCT 36.7  MCV 89.3  PLT 352   Basic Metabolic Panel: Recent Labs  Lab 12/05/22 1801  NA 133*  K 3.6  CL 96*  CO2 22  GLUCOSE 171*  BUN 8  CREATININE 0.87  CALCIUM 8.8*   GFR: Estimated Creatinine Clearance: 76.9 mL/min (by C-G formula based on SCr of 0.87 mg/dL). Liver Function Tests: Recent Labs  Lab 12/05/22 1801  AST 27  ALT 15  ALKPHOS 96  BILITOT 1.6*  PROT 8.1  ALBUMIN 3.6   No results for input(s): "LIPASE", "AMYLASE" in the last 168 hours. No results for input(s): "AMMONIA" in the last 168 hours. Coagulation Profile: Recent Labs  Lab 12/05/22 1801  INR 1.1   Cardiac Enzymes: No results for input(s): "CKTOTAL", "CKMB", "CKMBINDEX", "TROPONINI" in the last 168 hours. BNP (last 3 results) No results for input(s): "PROBNP" in the last 8760 hours. HbA1C: No results for input(s): "HGBA1C" in the last 72 hours. CBG: No results for input(s): "GLUCAP" in the last 168 hours. Lipid Profile: No results for input(s): "CHOL", "HDL", "LDLCALC", "TRIG", "CHOLHDL", "LDLDIRECT" in the last 72 hours. Thyroid Function Tests: No results for input(s): "TSH", "T4TOTAL", "FREET4", "T3FREE", "THYROIDAB" in the last 72 hours. Anemia Panel: No results for input(s): "VITAMINB12", "FOLATE", "FERRITIN", "TIBC", "IRON", "RETICCTPCT" in the last 72 hours. Urine analysis: No results found for: "COLORURINE", "APPEARANCEUR", "LABSPEC", "PHURINE", "GLUCOSEU", "HGBUR", "BILIRUBINUR", "KETONESUR", "PROTEINUR", "UROBILINOGEN", "NITRITE", "LEUKOCYTESUR"  Radiological Exams on Admission: DG Chest Portable 1 View  Result Date: 12/05/2022 CLINICAL DATA:  Respiratory distress EXAM: PORTABLE CHEST 1 VIEW COMPARISON:  Previous  studies including the examination of 08/07/2021 FINDINGS: Cardiac size is within normal limits. Central pulmonary vessels are more prominent. Increased interstitial markings are seen in both parahilar regions and both lower lung fields, more so on the left side. There is no focal consolidation. Costophrenic angles are clear. There is no pneumothorax. There are multiple old healed fractures on both sides. IMPRESSION: Central pulmonary vessels are more prominent suggesting CHF. Increased interstitial markings are seen in mid and lower lung fields, more so on the left side suggesting interstitial edema or interstitial pneumonia. Electronically Signed   By: Ernie Avena M.D.   On: 12/05/2022 18:59     Data Reviewed: Relevant notes from primary care and specialist visits, past discharge summaries as available in EHR, including Care Everywhere. Prior diagnostic testing as pertinent to current admission diagnoses Updated medications and problem lists for reconciliation ED course, including vitals, labs, imaging, treatment and response to treatment Triage notes, nursing and pharmacy notes and ED provider's notes Notable results as noted in HPI  Assessment and Plan: Sepsis (HCC) Influenza A Acute respiratory failure with hypoxia Patient with fever, tachycardia, tachypnea, leukocytosis and influenza A Sepsis fluids Will start Tamiflu even though symptoms started a week prior given that symptoms may have started as COPD with later onset of influenza A  Hold off on cefepime and azithromycin started in the ED as this is likely viral Antitussives, incentive spirometry and supportive care Continue supplemental oxygen to keep sats over 92% with BiPAP if needed  COPD with acute exacerbation (HCC) Scheduled and as needed nebulized bronchodilator treatment Will hold off on steroids given acute influenza A infection.  Can consider restarting if slow improvement to current treatment         DVT  prophylaxis: Lovenox  Consults: none  Advance Care Planning: full code  Family Communication: none  Disposition Plan: Back to previous home environment  Severity of Illness: The appropriate patient status for this patient is INPATIENT. Inpatient status is judged to be reasonable and necessary in order to provide the required intensity of service to ensure the patient's safety. The patient's presenting symptoms, physical exam findings, and initial radiographic and laboratory data in the context of their chronic comorbidities is felt to place them at high risk for further clinical deterioration. Furthermore, it is not anticipated that the patient will be medically stable for discharge from the hospital within 2 midnights of admission.   * I certify that at the point of admission it is my clinical judgment that the patient will require inpatient hospital care spanning beyond 2 midnights from the point of admission due to high intensity of service, high risk for further deterioration and high frequency of surveillance required.*  Author: Andris Baumann, MD 12/05/2022 7:51 PM  For on call review [email or web address].

## 2022-12-05 NOTE — Consult Note (Signed)
CODE SEPSIS - PHARMACY COMMUNICATION  **Broad Spectrum Antibiotics should be administered within 1 hour of Sepsis diagnosis**  Time Code Sepsis Called/Page Received: 1817  Antibiotics Ordered: Azithromycin, Cefepime  Time of 1st antibiotic administration: 1828  Additional action taken by pharmacy: N/A  Celene Squibb, PharmD PGY1 Pharmacy Resident 12/05/2022 6:47 PM

## 2022-12-05 NOTE — Consult Note (Signed)
PHARMACY -  BRIEF ANTIBIOTIC NOTE   Pharmacy has received consult(s) for Cefepime from an ED provider.  The patient's profile has been reviewed for ht/wt/allergies/indication/available labs.    One time order(s) placed for Cefepime 2g IV  Further antibiotics/pharmacy consults should be ordered by admitting physician if indicated.                       Thank you,  Celene Squibb, PharmD PGY1 Pharmacy Resident 12/05/2022 6:29 PM

## 2022-12-05 NOTE — ED Triage Notes (Signed)
EMS also gave 2g mag sulfate.

## 2022-12-05 NOTE — Assessment & Plan Note (Signed)
Scheduled and as needed nebulized bronchodilator treatment Will hold off on steroids given acute influenza A infection.  Can consider restarting if slow improvement to current treatment

## 2022-12-05 NOTE — Assessment & Plan Note (Addendum)
Influenza A Acute respiratory failure with hypoxia Patient with fever, tachycardia, tachypnea, leukocytosis and influenza A Sepsis fluids Will start Tamiflu even though symptoms started a week prior given that symptoms may have started as COPD with later onset of influenza A  Hold off on cefepime and azithromycin started in the ED as this is likely viral Antitussives, incentive spirometry and supportive care Continue supplemental oxygen to keep sats over 92% with BiPAP if needed

## 2022-12-05 NOTE — Hospital Course (Signed)
Presumed full code : Admission order signed prior to patient evaluation to facilitate timely admission from the ED. No prior code status documents to review.

## 2022-12-05 NOTE — ED Notes (Signed)
Pt given chicken broth per her request.

## 2022-12-05 NOTE — ED Notes (Signed)
Humidifier applied to O2 [place] 5L.

## 2022-12-05 NOTE — ED Triage Notes (Signed)
BIBA for resp distress, progressively worse over 1 week.  Lives at home with husband who called ems.  Spo2 on EMS arrival 84% on 3L [place].  EMS placed on cpap.  Duoneb and albuterol x3, solumedrol 125mg  and nitropaste on chest.

## 2022-12-05 NOTE — ED Provider Notes (Signed)
[facility] Provider Note    Event Date/Time   First MD Initiated Contact with Patient 12/05/22 1814     (approximate)   History   Respiratory Distress   HPI  Vickie Davis is a 61 y.o. female with a history of COPD on home oxygen 2 to 3 L at baseline presents to the ER for evaluation of several days of worsening cough shortness of breath.  Found to be hypoxic to the low 80s on her supplemental oxygen via EMS.  Did have diffuse wheezing was given Solu-Medrol as well as DuoNebs.  Placed on CPAP given respiratory distress and brought to the ER.     Physical Exam   Triage Vital Signs: ED Triage Vitals  Enc Vitals Group     BP 12/05/22 1758 (!) 128/109     Pulse Rate 12/05/22 1755 (!) 119     Resp 12/05/22 1758 (!) 28     Temp 12/05/22 1809 (!) 100.7 F (38.2 C)     Temp Source 12/05/22 1809 Axillary     SpO2 12/05/22 1753 (!) 84 %     Weight 12/05/22 1759 209 lb 7 oz (95 kg)     Height 12/05/22 1759 5\' 6"  (1.676 m)     Head Circumference --      Peak Flow --      Pain Score --      Pain Loc --      Pain Edu? --      Excl. in GC? --     Most recent vital signs: Vitals:   12/05/22 1900 12/05/22 1934  BP: (!) 112/57   Pulse: (!) 114   Resp: (!) 32   Temp:    SpO2: 97% 94%     Constitutional: Alert, acute respiratory distress. Eyes: Conjunctivae are normal.  Head: Atraumatic. Nose: No congestion/rhinnorhea. Mouth/Throat: Mucous membranes are moist.   Neck: Painless ROM.  Cardiovascular:   Good peripheral circulation. Respiratory tachypnea tight breath sounds and faint expiratory wheeze throughout..  Gastrointestinal: Soft and nontender.  Musculoskeletal:  no deformity Neurologic:  MAE spontaneously. No gross focal neurologic deficits are appreciated.  Skin:  Skin is warm, dry and intact. No rash noted. Psychiatric: Mood and affect are normal. Speech and behavior are normal.    ED Results / Procedures / Treatments   Labs (all  labs ordered are listed, but only abnormal results are displayed) Labs Reviewed  RESP PANEL BY RT-PCR (RSV, FLU A&B, COVID)  RVPGX2 - Abnormal; Notable for the following components:      Result Value   Influenza A by PCR POSITIVE (*)    All other components within normal limits  CBC WITH DIFFERENTIAL/PLATELET - Abnormal; Notable for the following components:   WBC 12.3 (*)    Neutro Abs 10.8 (*)    Lymphs Abs 0.6 (*)    Abs Immature Granulocytes 0.08 (*)    All other components within normal limits  COMPREHENSIVE METABOLIC PANEL - Abnormal; Notable for the following components:   Sodium 133 (*)    Chloride 96 (*)    Glucose, Bld 171 (*)    Calcium 8.8 (*)    Total Bilirubin 1.6 (*)    All other components within normal limits  CULTURE, BLOOD (ROUTINE X 2)  CULTURE, BLOOD (ROUTINE X 2)  BLOOD GAS, VENOUS  LACTIC ACID, PLASMA  PROTIME-INR  APTT  LACTIC ACID, PLASMA  URINALYSIS, COMPLETE (UACMP) WITH MICROSCOPIC     EKG  ED ECG REPORT  I, Willy Eddy, the attending physician, personally viewed and interpreted this ECG.   Date: 12/05/2022  EKG Time: 17:54  Rate: 120  Rhythm: sinus  Axis: normal  Intervals: prolonged qt  ST&T Change: nonspecific st abn, no stemi    RADIOLOGY Please see ED Course for my review and interpretation.  I personally reviewed all radiographic images ordered to evaluate for the above acute complaints and reviewed radiology reports and findings.  These findings were personally discussed with the patient.  Please see medical record for radiology report.    PROCEDURES:  Critical Care performed: Yes, see critical care procedure note(s)  .Critical Care  Performed by: Willy Eddy, MD Authorized by: Willy Eddy, MD   Critical care provider statement:    Critical care time (minutes):  45   Critical care was necessary to treat or prevent imminent or life-threatening deterioration of the following conditions:  Respiratory  failure   Critical care was time spent personally by me on the following activities:  Ordering and performing treatments and interventions, ordering and review of laboratory studies, ordering and review of radiographic studies, pulse oximetry, re-evaluation of patient's condition, review of old charts, obtaining history from patient or surrogate, examination of patient, evaluation of patient's response to treatment, discussions with primary provider, discussions with consultants and development of treatment plan with patient or surrogate    MEDICATIONS ORDERED IN ED: Medications  lactated ringers infusion ( Intravenous New Bag/Given 12/05/22 1842)  azithromycin (ZITHROMAX) 500 mg in sodium chloride 0.9 % 250 mL IVPB (500 mg Intravenous New Bag/Given 12/05/22 1928)  oseltamivir (TAMIFLU) capsule 75 mg (has no administration in time range)  acetaminophen (TYLENOL) tablet 1,000 mg (has no administration in time range)  ipratropium-albuterol (DUONEB) 0.5-2.5 (3) MG/3ML nebulizer solution (6 mLs  Given 12/05/22 1803)  ceFEPIme (MAXIPIME) 2 g in sodium chloride 0.9 % 100 mL IVPB (0 g Intravenous Stopped 12/05/22 1917)     IMPRESSION / MDM / ASSESSMENT AND PLAN / ED COURSE  I reviewed the triage vital signs and the nursing notes.                              Differential diagnosis includes, but is not limited to, Asthma, copd, CHF, pna, ptx, malignancy, Pe, anemia  Patient presenting to the ER for evaluation of symptoms as described above.  Based on symptoms, risk factors and considered above differential, this presenting complaint could reflect a potentially life-threatening illness therefore the patient will be placed on continuous pulse oximetry and telemetry for monitoring.  Laboratory evaluation will be sent to evaluate for the above complaints.  Patient in acute respiratory distress placed on BiPAP.  Given additional nebulizers.  She is found to be febrile tachycardic.  Will start septic workup  will order broad-spectrum antibiotics.  Clinical Course as of 12/05/22 [name] Dec 05, 2022  6010 Patient reassessed.  Significant improvement on BiPAP.  Blood gas is reassuring.  Leukocytosis.  She is febrile.  Will start empiric antibiotics. [PR]  1846 Chest x-ray on my review and interpretation is concerning for infiltrate.  Will await formal radiology report. [PR]  9323 Patient reassessed.  Respiratory status does appear to be improving she is satting well.   [PR]  1928 Patient is flu positive.  Will give Tamiflu.  Lactate normal.  Will trial off of BiPAP given her significant improvement in symptoms.  Will consult hospitalist for admission. [PR]    Clinical Course User  Index [PR] Willy Eddy, MD     FINAL CLINICAL IMPRESSION(S) / ED DIAGNOSES   Final diagnoses:  COPD exacerbation (HCC)  Sepsis with acute hypoxic respiratory failure without septic shock, due to unspecified organism [facility])  Influenza A     Rx / DC Orders   ED Discharge Orders     None        Note:  This document was prepared using Dragon voice recognition software and may include unintentional dictation errors.    Willy Eddy, MD 12/05/22 Barry Brunner

## 2022-12-05 NOTE — ED Notes (Signed)
Pt Bipap removed per EDP Roxan Hockey verbal order.

## 2022-12-05 NOTE — ED Notes (Signed)
Messaged IP MD Para March and NP Jon Billings regarding pt BP readings being low.  Requested fluid bolus or alt for pressure support. Pt O2 92 on 5L [place], requested humidified O2 for comfort as well.

## 2022-12-06 ENCOUNTER — Other Ambulatory Visit: Payer: Self-pay

## 2022-12-06 ENCOUNTER — Encounter: Payer: Self-pay | Admitting: Internal Medicine

## 2022-12-06 LAB — BLOOD CULTURE ID PANEL (REFLEXED) - BCID2

## 2022-12-06 LAB — PROCALCITONIN
Procalcitonin: 0.11 ng/mL
Procalcitonin: 0.43 ng/mL

## 2022-12-06 LAB — LACTIC ACID, PLASMA: Lactic Acid, Venous: 0.6 mmol/L (ref 0.5–1.9)

## 2022-12-06 LAB — CORTISOL-AM, BLOOD: Cortisol - AM: 8.9 ug/dL (ref 6.7–22.6)

## 2022-12-06 LAB — HIV ANTIBODY (ROUTINE TESTING W REFLEX): HIV Screen 4th Generation wRfx: NONREACTIVE

## 2022-12-06 MED ORDER — OSELTAMIVIR PHOSPHATE 75 MG PO CAPS
75.0000 mg | ORAL_CAPSULE | Freq: Two times a day (BID) | ORAL | Status: DC
  Administered 2022-12-06: 75 mg via ORAL
  Filled 2022-12-06 (×2): qty 1

## 2022-12-06 MED ORDER — FUROSEMIDE 10 MG/ML IJ SOLN
40.0000 mg | Freq: Once | INTRAMUSCULAR | Status: AC
  Administered 2022-12-06: 40 mg via INTRAVENOUS
  Filled 2022-12-06: qty 4

## 2022-12-06 MED ORDER — IPRATROPIUM-ALBUTEROL 0.5-2.5 (3) MG/3ML IN SOLN
3.0000 mL | RESPIRATORY_TRACT | Status: DC | PRN
  Administered 2022-12-06 (×2): 3 mL via RESPIRATORY_TRACT
  Filled 2022-12-06 (×3): qty 3

## 2022-12-06 MED ORDER — SALINE SPRAY 0.65 % NA SOLN
1.0000 | NASAL | Status: DC | PRN
  Filled 2022-12-06 (×2): qty 44

## 2022-12-06 MED ORDER — OSELTAMIVIR PHOSPHATE 75 MG PO CAPS
75.0000 mg | ORAL_CAPSULE | Freq: Two times a day (BID) | ORAL | Status: DC
  Administered 2022-12-06 – 2022-12-09 (×6): 75 mg via ORAL
  Filled 2022-12-06 (×7): qty 1

## 2022-12-06 NOTE — ED Notes (Signed)
Pt. Ambulating with PT, PT states pt. Records O2 sats 87-93% while ambulating on 5L [place]. Dr. Fran Lowes notified.

## 2022-12-06 NOTE — Evaluation (Signed)
Physical Therapy Evaluation Patient Details Name: Vickie Davis MRN: [id number] DOB: [birth year] Today's Date: 12/06/2022  History of Present Illness  Vickie Davis is a 61 y.o. female with medical history significant for COPD who presents to the ED by EMS for evaluation of cough and shortness of breath that began a week prior and progressively worsened. Influenza A PCR positive.  Clinical Impression  Pt pleasant and motivated with PT session.  She was able to do some in-room ambulation w/o AD and showed good safety and confidence, though she did fatigue and requested to sit after ~66ft of ambulation.  Pt on 2-3L O2 at home, but needing 5-6L to maintain close to SpO2 near 90% (vacillating 87-93% during activity).  She showed good confidence and ability to return home.  Pt will benefit from PT to work back to baseline, depending on her activity tolerance at time of D/C HHPT will likely be appropriate.     Recommendations for follow up therapy are one component of a multi-disciplinary discharge planning process, led by the attending physician.  Recommendations may be updated based on patient status, additional functional criteria and insurance authorization.  Follow Up Recommendations Home health PT      Assistance Recommended at Discharge Intermittent Supervision/Assistance  Patient can return home with the following  Assistance with cooking/housework;Help with stairs or ramp for entrance;Assist for transportation    Equipment Recommendations None recommended by PT  Recommendations for Other Services       Functional Status Assessment Patient has had a recent decline in their functional status and demonstrates the ability to make significant improvements in function in a reasonable and predictable amount of time.     Precautions / Restrictions Precautions Precautions: None Restrictions Weight Bearing Restrictions: No      Mobility  Bed Mobility Overal bed mobility: Independent                   Transfers Overall transfer level: Modified independent                 General transfer comment: Pt to rise w/o hesitation    Ambulation/Gait Ambulation/Gait assistance: Supervision Gait Distance (Feet): 65 Feet Assistive device: None         General Gait Details: Able to do multiple loops in her iso room, 6L O2 t/o the effort.  No AD, no LOB some fatigue.  O2 remained int he 86-93% range with only mild c/o fatigue and shortness of breath.  Good confidence and safety, but did fatigue much more than baseline with the modest effort.  Stairs            Wheelchair Mobility    Modified Rankin (Stroke Patients Only)       Balance Overall balance assessment: Modified Independent Sitting-balance support: No upper extremity supported Sitting balance-Leahy Scale: Normal     Standing balance support: Bilateral upper extremity supported Standing balance-Leahy Scale: Good                               Pertinent Vitals/Pain Pain Assessment Pain Assessment: No/denies pain    Home Living Family/patient expects to be discharged to:: Private residence Living Arrangements: Spouse/significant other;Children Available Help at Discharge: Family;Available 24 hours/day   Home Access: Stairs to enter Entrance Stairs-Rails: Right;Left Entrance Stairs-Number of Steps: 4   Home Layout: One level Home Equipment: Agricultural consultant (2 wheels);Cane - single point;Shower seat - built in  Prior Function Prior Level of Function : Independent/Modified Independent             Mobility Comments: on 2-3L O2 baseline, uses SPC as needed       Hand Dominance        Extremity/Trunk Assessment   Upper Extremity Assessment Upper Extremity Assessment: Overall WFL for tasks assessed    Lower Extremity Assessment Lower Extremity Assessment: Generalized weakness       Communication   Communication: No difficulties  Cognition  Arousal/Alertness: Awake/alert Behavior During Therapy: WFL for tasks assessed/performed Overall Cognitive Status: Within Functional Limits for tasks assessed                                          General Comments General comments (skin integrity, edema, etc.): SpO2 88% on 5L [place] during mobility    Exercises     Assessment/Plan    PT Assessment Patient needs continued PT services  PT Problem List Decreased strength;Decreased range of motion;Decreased balance;Decreased activity tolerance;Decreased coordination;Decreased mobility;Decreased safety awareness       PT Treatment Interventions Gait training;Stair training;Functional mobility training;Therapeutic activities;Therapeutic exercise;Balance training;Patient/family education    PT Goals (Current goals can be found in the Care Plan section)  Acute Rehab PT Goals Patient Stated Goal: go home PT Goal Formulation: With patient Time For Goal Achievement: 12/19/22 Potential to Achieve Goals: Good    Frequency Min 2X/week     Co-evaluation               AM-PAC PT "6 Clicks" Mobility  Outcome Measure Help needed turning from your back to your side while in a flat bed without using bedrails?: None Help needed moving from lying on your back to sitting on the side of a flat bed without using bedrails?: None Help needed moving to and from a bed to a chair (including a wheelchair)?: None Help needed standing up from a chair using your arms (e.g., wheelchair or bedside chair)?: None Help needed to walk in hospital room?: A Little Help needed climbing 3-5 steps with a railing? : A Little 6 Click Score: 22    End of Session Equipment Utilized During Treatment: Gait belt;Oxygen (5-6L) Activity Tolerance: Patient tolerated treatment well Patient left: in bed;with call bell/phone within reach Nurse Communication: Mobility status PT Visit Diagnosis: Muscle weakness (generalized) (M62.81);Difficulty in walking,  not elsewhere classified (R26.2);Unsteadiness on feet (R26.81)    Time: 2841-3244 PT Time Calculation (min) (ACUTE ONLY): 26 min   Charges:   PT Evaluation $PT Eval Low Complexity: 1 Low PT Treatments $Gait Training: 8-22 mins        Malachi Pro, DPT 12/06/2022, 11:06 AM

## 2022-12-06 NOTE — ED Notes (Signed)
PT at bedside.

## 2022-12-06 NOTE — Evaluation (Signed)
Occupational Therapy Evaluation Patient Details Name: Vickie Davis MRN: [id number] DOB: 10/01/1961 Today's Date: 12/06/2022   History of Present Illness Vickie Davis is a 61 y.o. female with medical history significant for COPD who presents to the ED by EMS for evaluation of cough and shortness of breath that began a week prior and progressively worsened. Influenza A PCR positive.   Clinical Impression   Vickie Davis was seen for OT evaluation this date. Prior to hospital admission, pt was MOD I using O2 for mobility and SPC as needed. Pt lives with spouse and children and 24/7 supervision. Pt presents to acute OT demonstrating impaired ADL performance and functional mobility 2/2 decreased activity tolerance. Pt currently requires MOD A don B socks, endorses only wearing slip on shoes at baseline and adapted strategies for LB access. SUPERVISION toilet t/f, assist for line mgmt. SpO2 88% on 5L [place] standing. Educated on ECS with good understanding. All education complete, no acute OT needs identified, will sign off. Upon hospital discharge, recommend no OT follow up, may benefit from cardiac rehab.   Recommendations for follow up therapy are one component of a multi-disciplinary discharge planning process, led by the attending physician.  Recommendations may be updated based on patient status, additional functional criteria and insurance authorization.   Follow Up Recommendations  No OT follow up     Assistance Recommended at Discharge Set up Supervision/Assistance  Patient can return home with the following Help with stairs or ramp for entrance    Functional Status Assessment  Patient has had a recent decline in their functional status and demonstrates the ability to make significant improvements in function in a reasonable and predictable amount of time.  Equipment Recommendations  None recommended by OT    Recommendations for Other Services       Precautions / Restrictions  Precautions Precautions: None Restrictions Weight Bearing Restrictions: No      Mobility Bed Mobility Overal bed mobility: Independent                  Transfers Overall transfer level: Modified independent Equipment used:  (O2)                      Balance Overall balance assessment: Needs assistance Sitting-balance support: No upper extremity supported Sitting balance-Leahy Scale: Normal     Standing balance support: No upper extremity supported Standing balance-Leahy Scale: Good                             ADL either performed or assessed with clinical judgement   ADL Overall ADL's : Needs assistance/impaired                                       General ADL Comments: MOD A don B socks, endorses only wearinf slip on shoes baseline, adapted strategies for LB access. SUPERVISION toilet t/f, assist for line mgmt.      Pertinent Vitals/Pain Pain Assessment Pain Assessment: No/denies pain     Hand Dominance     Extremity/Trunk Assessment Upper Extremity Assessment Upper Extremity Assessment: Overall WFL for tasks assessed   Lower Extremity Assessment Lower Extremity Assessment: Generalized weakness       Communication Communication Communication: No difficulties   Cognition Arousal/Alertness: Awake/alert Behavior During Therapy: WFL for tasks assessed/performed Overall Cognitive Status: Within Functional Limits for  tasks assessed                                       General Comments  SpO2 88% on 5L [place] during mobility            Home Living Family/patient expects to be discharged to:: Private residence Living Arrangements: Spouse/significant other;Children Available Help at Discharge: Family;Available 24 hours/day   Home Access: Stairs to enter                     Home Equipment: Agricultural consultant (2 wheels);Cane - single point;Shower seat - built in          Prior  Functioning/Environment Prior Level of Function : Independent/Modified Independent             Mobility Comments: on 2-3L O2 baseline, uses SPC as needed          OT Problem List: Decreased activity tolerance;Cardiopulmonary status limiting activity         OT Goals(Current goals can be found in the care plan section) Acute Rehab OT Goals Patient Stated Goal: to breathe better OT Goal Formulation: With patient Time For Goal Achievement: 12/20/22 Potential to Achieve Goals: Good   AM-PAC OT "6 Clicks" Daily Activity     Outcome Measure Help from another person eating meals?: None Help from another person taking care of personal grooming?: None Help from another person toileting, which includes using toliet, bedpan, or urinal?: A Little Help from another person bathing (including washing, rinsing, drying)?: A Little Help from another person to put on and taking off regular upper body clothing?: None Help from another person to put on and taking off regular lower body clothing?: A Lot 6 Click Score: 20   End of Session Equipment Utilized During Treatment: Oxygen  Activity Tolerance: Patient tolerated treatment well Patient left: in bed;with call bell/phone within reach  OT Visit Diagnosis: Unsteadiness on feet (R26.81)                Time: 8590-9311 OT Time Calculation (min): 14 min Charges:  OT General Charges $OT Visit: 1 Visit OT Evaluation $OT Eval Low Complexity: 1 Low  Kathie Dike, M.S. OTR/L  12/06/22, 10:46 AM  ascom [phone]

## 2022-12-06 NOTE — ED Notes (Signed)
Pt daughter Shawna Orleans [phone]; granddaughter Osborne Casco [phone]

## 2022-12-06 NOTE — Progress Notes (Signed)
PHARMACY - PHYSICIAN COMMUNICATION CRITICAL VALUE ALERT - BLOOD CULTURE IDENTIFICATION (BCID)  Vickie Davis is an 61 y.o. female who presented to [facility] on 12/05/2022 with a chief complaint of respiratory distress  Assessment:  Blood cultures from 12/20 with [facility] In 1 of 4 bottles, BCID detects staphylococcus species (NOT S aureus).    Name of physician (or Provider) Contacted: Dr Fran Lowes  Current antibiotics: none currently, Cefepime/azithromycin x1  Changes to prescribed antibiotics recommended:  Likely contaminant - physician to evaluate patient  Results for orders placed or performed during the hospital encounter of 12/05/22  Blood Culture ID Panel (Reflexed) (Collected: 12/05/2022  6:25 PM)  Result Value Ref Range   Enterococcus faecalis NOT DETECTED NOT DETECTED   Enterococcus Faecium NOT DETECTED NOT DETECTED   Listeria monocytogenes NOT DETECTED NOT DETECTED   Staphylococcus species DETECTED (A) NOT DETECTED   Staphylococcus aureus (BCID) NOT DETECTED NOT DETECTED   Staphylococcus epidermidis NOT DETECTED NOT DETECTED   Staphylococcus lugdunensis NOT DETECTED NOT DETECTED   Streptococcus species NOT DETECTED NOT DETECTED   Streptococcus agalactiae NOT DETECTED NOT DETECTED   Streptococcus pneumoniae NOT DETECTED NOT DETECTED   Streptococcus pyogenes NOT DETECTED NOT DETECTED   A.calcoaceticus-baumannii NOT DETECTED NOT DETECTED   Bacteroides fragilis NOT DETECTED NOT DETECTED   Enterobacterales NOT DETECTED NOT DETECTED   Enterobacter cloacae complex NOT DETECTED NOT DETECTED   Escherichia coli NOT DETECTED NOT DETECTED   Klebsiella aerogenes NOT DETECTED NOT DETECTED   Klebsiella oxytoca NOT DETECTED NOT DETECTED   Klebsiella pneumoniae NOT DETECTED NOT DETECTED   Proteus species NOT DETECTED NOT DETECTED   Salmonella species NOT DETECTED NOT DETECTED   Serratia marcescens NOT DETECTED NOT DETECTED   Haemophilus influenzae NOT DETECTED NOT DETECTED   Neisseria  meningitidis NOT DETECTED NOT DETECTED   Pseudomonas aeruginosa NOT DETECTED NOT DETECTED   Stenotrophomonas maltophilia NOT DETECTED NOT DETECTED   Candida albicans NOT DETECTED NOT DETECTED   Candida auris NOT DETECTED NOT DETECTED   Candida glabrata NOT DETECTED NOT DETECTED   Candida krusei NOT DETECTED NOT DETECTED   Candida parapsilosis NOT DETECTED NOT DETECTED   Candida tropicalis NOT DETECTED NOT DETECTED   Cryptococcus neoformans/gattii NOT DETECTED NOT DETECTED    Juliette Alcide, PharmD, BCPS, BCIDP Work Cell: [phone] 12/06/2022 12:23 PM

## 2022-12-06 NOTE — Progress Notes (Signed)
  PROGRESS NOTE    Vickie Davis  WEX:[id number] DOB: November 21, 1961 DOA: 12/05/2022 PCP: System, Provider Not In  ED16A/ED16A  LOS: 1 day   Brief hospital course:   Assessment & Plan: Vickie Davis is a 61 y.o. female with medical history significant for COPD on 2L at baseline who presents to the ED by EMS for evaluation of cough and shortness of breath that began a week prior and progressively worsened.  Symptoms were not improved with home bronchodilator treatments.  She denies chest pain, fever or chills.  On arrival of EMS she was in acute respiratory distress with O2 sat 84% on room air requiring CPAP for transport.  She received DuoNebs, Solu-Medrol as well as Nitropaste and route.    Sepsis (HCC) Patient with fever, tachycardia, tachypnea, leukocytosis and influenza A Sepsis fluids  Acute on chronic respiratory failure with hypoxia 2L O2 at baseline --contributed by Flu A, COPD, and pulm edema (as seen on CXR). --oxygen requirement up to 5L --treat underlying causes --Continue supplemental O2 to keep sats >=90%, wean as tolerated  Influenza A --started Tamiflu on admission --Hold off on cefepime and azithromycin started in the ED as this is viral Antitussives, incentive spirometry and supportive care  Pulm edema --unknown chronicity.  CXR showed Increased interstitial markings. --IV lasix 40 mg x1  COPD with acute exacerbation (HCC) Scheduled DuoNeb --hold off steroid for now   DVT prophylaxis: Lovenox SQ Code Status: Full code  Family Communication:  Level of care: Progressive Dispo:   The patient is from: home Anticipated d/c is to: home Anticipated d/c date is: 2-3 days   Subjective and Interval History:  Pt reported bad cough which caused dyspnea.   Objective: Vitals:   12/06/22 1415 12/06/22 1500 12/06/22 1600 12/06/22 1719  BP:  109/68 (!) 115/97 110/66  Pulse: 100 100 (!) 101 89  Resp: 19 20 (!) 25 20  Temp:    98.9 F (37.2 C)  TempSrc:    Oral   SpO2: 95% 96% 96% 96%  Weight:      Height:        Intake/Output Summary (Last 24 hours) at 12/06/2022 1744 Last data filed at 12/06/2022 1700 Gross per 24 hour  Intake 400 ml  Output 3000 ml  Net -2600 ml   Filed Weights   12/05/22 1759  Weight: 95 kg    Examination:   Constitutional: NAD, AAOx3 HEENT: conjunctivae and lids normal, EOMI CV: No cyanosis.   RESP: mild rhonchi, reduced air movement, on 5L Neuro: II - XII grossly intact.   Psych: Normal mood and affect.  Appropriate judgement and reason   Data Reviewed: I have personally reviewed labs and imaging studies  Time spent: 50 minutes  Darlin Priestly, MD [facility] If 7PM-7AM, please contact night-coverage 12/06/2022, 5:44 PM

## 2022-12-07 LAB — MAGNESIUM: Magnesium: 1.9 mg/dL (ref 1.7–2.4)

## 2022-12-07 LAB — BASIC METABOLIC PANEL
Anion gap: 9 (ref 5–15)
BUN: 13 mg/dL (ref 8–23)
CO2: 27 mmol/L (ref 22–32)
Calcium: 8.5 mg/dL — ABNORMAL LOW (ref 8.9–10.3)
Chloride: 103 mmol/L (ref 98–111)
Creatinine, Ser: 0.78 mg/dL (ref 0.44–1.00)
GFR, Estimated: >60 mL/min (ref >60)
Glucose, Bld: 107 mg/dL — ABNORMAL HIGH (ref 70–99)
Potassium: 3.2 mmol/L — ABNORMAL LOW (ref 3.5–5.1)
Sodium: 139 mmol/L (ref 135–145)

## 2022-12-07 LAB — CBC
HCT: 32.5 % — ABNORMAL LOW (ref 36.0–46.0)
Hemoglobin: 10.8 g/dL — ABNORMAL LOW (ref 12.0–15.0)
MCH: 29.8 pg (ref 26.0–34.0)
MCHC: 33.2 g/dL (ref 30.0–36.0)
MCV: 89.8 fL (ref 80.0–100.0)
Platelets: 318 10*3/uL (ref 150–400)
RBC: 3.62 MIL/uL — ABNORMAL LOW (ref 3.87–5.11)
RDW: 12.9 % (ref 11.5–15.5)
WBC: 9.1 10*3/uL (ref 4.0–10.5)
nRBC: 0 % (ref 0.0–0.2)

## 2022-12-07 MED ORDER — POTASSIUM CHLORIDE CRYS ER 20 MEQ PO TBCR
40.0000 meq | EXTENDED_RELEASE_TABLET | Freq: Once | ORAL | Status: AC
  Administered 2022-12-07: 40 meq via ORAL
  Filled 2022-12-07: qty 2

## 2022-12-07 MED ORDER — FUROSEMIDE 10 MG/ML IJ SOLN
40.0000 mg | Freq: Two times a day (BID) | INTRAMUSCULAR | Status: DC
  Administered 2022-12-07 – 2022-12-08 (×3): 40 mg via INTRAVENOUS
  Filled 2022-12-07 (×3): qty 4

## 2022-12-07 MED ORDER — IPRATROPIUM-ALBUTEROL 0.5-2.5 (3) MG/3ML IN SOLN
3.0000 mL | Freq: Four times a day (QID) | RESPIRATORY_TRACT | Status: DC
  Administered 2022-12-07 – 2022-12-08 (×3): 3 mL via RESPIRATORY_TRACT
  Filled 2022-12-07 (×3): qty 3

## 2022-12-07 MED ORDER — TRAZODONE HCL 100 MG PO TABS
100.0000 mg | ORAL_TABLET | Freq: Every day | ORAL | Status: DC
  Administered 2022-12-07 – 2022-12-08 (×2): 100 mg via ORAL
  Filled 2022-12-07 (×2): qty 1

## 2022-12-07 MED ORDER — UMECLIDINIUM BROMIDE 62.5 MCG/ACT IN AEPB
1.0000 | INHALATION_SPRAY | Freq: Every day | RESPIRATORY_TRACT | Status: DC
  Administered 2022-12-07 – 2022-12-09 (×3): 1 via RESPIRATORY_TRACT
  Filled 2022-12-07: qty 7

## 2022-12-07 MED ORDER — IPRATROPIUM-ALBUTEROL 0.5-2.5 (3) MG/3ML IN SOLN: 3.0000 mL | RESPIRATORY_TRACT | Status: DC

## 2022-12-07 MED ORDER — DOXYLAMINE SUCCINATE (SLEEP) 25 MG PO TABS
25.0000 mg | ORAL_TABLET | Freq: Every day | ORAL | Status: DC
  Administered 2022-12-07 – 2022-12-08 (×2): 25 mg via ORAL
  Filled 2022-12-07 (×3): qty 1

## 2022-12-07 MED ORDER — MOMETASONE FURO-FORMOTEROL FUM 200-5 MCG/ACT IN AERO
2.0000 | INHALATION_SPRAY | Freq: Two times a day (BID) | RESPIRATORY_TRACT | Status: DC
  Administered 2022-12-07 – 2022-12-09 (×5): 2 via RESPIRATORY_TRACT
  Filled 2022-12-07: qty 8.8

## 2022-12-07 MED ORDER — PREDNISONE 20 MG PO TABS
40.0000 mg | ORAL_TABLET | Freq: Every day | ORAL | Status: DC
  Administered 2022-12-07 – 2022-12-09 (×3): 40 mg via ORAL
  Filled 2022-12-07 (×3): qty 2

## 2022-12-07 NOTE — Care Management Important Message (Signed)
Important Message  Patient Details  Name: Vickie Davis MRN: [id number] Date of Birth: [birth year]   Medicare Important Message Given:  N/A - LOS <3 / Initial given by admissions     Olegario Messier A Joshoa Shawler 12/07/2022, 3:41 PM

## 2022-12-07 NOTE — Progress Notes (Signed)
Nutrition Brief Note  Pt consulted for assessment of nutritional requirements/ status.   Wt Readings from Last 15 Encounters:  12/05/22 95 kg   Pt with medical history significant for COPD on 2L at baseline who presents for evaluation of cough and shortness of breath that began a week prior and progressively worsened. Symptoms were not improved with home bronchodilator treatments.   Pt admitted with influenza A.   Reviewed I/O's: -2.2 L x 24 hours  UOP: 2.6 L x 24 hours  Pt receiving personal care at time of visit.   Reviewed notes from Care Everywhere. Noted wt on 02/20/2019 was 210#.   Body mass index is 33.8 kg/m. Patient meets criteria for obesity, class I based on current BMI. Obesity is a complex, chronic medical condition that is optimally managed by a multidisciplinary care team. Weight loss is not an ideal goal for an acute inpatient hospitalization. However, if further work-up for obesity is warranted, consider outpatient referral to outpatient bariatric service and/or [facility].    Current diet order is regular, patient is consuming approximately n/a% of meals at this time. Labs and medications reviewed.   No nutrition interventions warranted at this time. If nutrition issues arise, please consult RD.   Levada Schilling, RD, LDN, CDCES Registered Dietitian II Certified Diabetes Care and Education Specialist Please refer to [facility] for RD and/or RD on-call/weekend/after hours pager

## 2022-12-07 NOTE — Progress Notes (Signed)
  PROGRESS NOTE    Vickie Davis  ZDG:[id number] DOB: 02-25-1961 DOA: 12/05/2022 PCP: System, Provider Not In  247A/247A-AA  LOS: 2 days   Brief hospital course:   Assessment & Plan: Vickie Davis is a 61 y.o. female with medical history significant for COPD on 2L at baseline who presents to the ED by EMS for evaluation of cough and shortness of breath that began a week prior and progressively worsened.  Symptoms were not improved with home bronchodilator treatments.  She denies chest pain, fever or chills.  On arrival of EMS she was in acute respiratory distress with O2 sat 84% on room air requiring CPAP for transport.  She received DuoNebs, Solu-Medrol as well as Nitropaste and route.    Sepsis (HCC) Patient with fever, tachycardia, tachypnea, leukocytosis and influenza A Sepsis fluids  Acute on chronic respiratory failure with hypoxia 2L O2 at baseline --contributed by Flu A, COPD, and pulm edema (as seen on CXR). --oxygen requirement up to 5L Plan: --treat underlying causes --Continue supplemental O2 to keep sats >=90%, wean as tolerated  Influenza A --started Tamiflu on admission --Hold off on cefepime and azithromycin started in the ED as this is viral --Antitussives, incentive spirometry and supportive care --cont Tamiflu  Pulm edema --unknown chronicity.  CXR showed Increased interstitial markings. --IV lasix 40 mg daily  COPD with acute exacerbation (HCC) --start prednisone 40 mg daily --increase scheduled DuoNeb to q4h while awake --cont home daily bronchodilators    DVT prophylaxis: Lovenox SQ Code Status: Full code  Family Communication: daughter updated at bedside today Level of care: Progressive Dispo:   The patient is from: home Anticipated d/c is to: home Anticipated d/c date is: 2-3 days   Subjective and Interval History:  Pt reported still feeling very short of breath, with cough.  Having hard time sleeping last night.   Objective: Vitals:    12/07/22 0839 12/07/22 0844 12/07/22 1120 12/07/22 1600  BP:  115/72 103/63 110/71  Pulse:  (!) 103 (!) 110 91  Resp:  20 20 18   Temp:  98 F (36.7 C) 98.3 F (36.8 C) 98.1 F (36.7 C)  TempSrc:    Oral  SpO2: 91% 97% 93% 95%  Weight:      Height:        Intake/Output Summary (Last 24 hours) at 12/07/2022 1644 Last data filed at 12/07/2022 1315 Gross per 24 hour  Intake --  Output 2650 ml  Net -2650 ml   Filed Weights   12/05/22 1759  Weight: 95 kg    Examination:   Constitutional: NAD, AAOx3 HEENT: conjunctivae and lids normal, EOMI CV: No cyanosis.   RESP: increased RR with some pursed lip breathing, some minor wheezes, on 4L Neuro: II - XII grossly intact.   Psych: Normal mood and affect.  Appropriate judgement and reason   Data Reviewed: I have personally reviewed labs and imaging studies  Time spent: 35 minutes  12/07/22, MD [facility] If 7PM-7AM, please contact night-coverage 12/07/2022, 4:44 PM

## 2022-12-07 NOTE — Progress Notes (Signed)
PT Cancellation Note  Patient Details Name: Vickie Davis MRN: [id number] DOB: 08-19-1961   Cancelled Treatment:    Reason Eval/Treat Not Completed: Patient declined, no reason specified; 1047: pt eating requesting PT to come back, 1200: pt refused d/t fatigue.    [name] A Azaela Caracci 12/07/2022, 12:45 PM

## 2022-12-07 NOTE — TOC Initial Note (Signed)
Transition of Care [facility]) - Initial/Assessment Note    Patient Details  Name: Vickie Davis MRN: [id number] Date of Birth: 02-12-1961  Transition of Care [facility]) CM/SW Contact:    Darolyn Rua, LCSW Phone Number: 12/07/2022, 10:29 AM  Clinical Narrative:                  CSW spoke with patient regarding home health recommendations. She reports she does not feel like she needs home health services. Reports she has no home needs, has O2 at home typically 2-3L with home care specialists. Currently on 5L.   TOC will follow for additional needs.   Expected Discharge Plan: Home/Self Care Barriers to Discharge: Continued Medical Work up   Patient Goals and CMS Choice     Choice offered to / list presented to : Patient [place] ownership interest in [facility].provided to:: Patient    Expected Discharge Plan and Services       Living arrangements for the past 2 months: Single Family Home                                      Prior Living Arrangements/Services Living arrangements for the past 2 months: Single Family Home Lives with:: Self                   Activities of Daily Living Home Assistive Devices/Equipment: None ADL Screening (condition at time of admission) Patient's cognitive ability adequate to safely complete daily activities?: Yes Is the patient deaf or have difficulty hearing?: No Does the patient have difficulty seeing, even when wearing glasses/contacts?: No Does the patient have difficulty concentrating, remembering, or making decisions?: No Patient able to express need for assistance with ADLs?: Yes Does the patient have difficulty dressing or bathing?: No Independently performs ADLs?: Yes (appropriate for developmental age) Does the patient have difficulty walking or climbing stairs?: No Weakness of Legs: Both Weakness of Arms/Hands: None  Permission Sought/Granted                  Emotional Assessment               Admission diagnosis:  Influenza A [J10.1] COPD exacerbation (HCC) [J44.1] Sepsis with acute hypoxic respiratory failure without septic shock, due to unspecified organism (HCC) [A41.9, R65.20, J96.01] Patient Active Problem List   Diagnosis Date Noted   Influenza A 12/05/2022   Sepsis (HCC) 12/05/2022   Acute respiratory failure with hypoxia (HCC) 12/05/2022   Myocardial injury 12/09/2017   Elevated troponin 12/08/2017   Degenerative disc disease, cervical 06/14/2014   Smoker 09/01/2012   COPD with acute exacerbation (HCC) 09/01/2012   Environmental allergies 03/10/2012   PCP:  System, Provider Not In Pharmacy:   [facility] - [place], [place] - [address] [place] [address] Phone: [phone] Fax: [phone]     Social Determinants of Health (SDOH) Social History: SDOH Screenings   Food Insecurity: No Food Insecurity (12/06/2022)  Housing: Low Risk  (12/07/2022)  Transportation Needs: No Transportation Needs (12/06/2022)  Utilities: Not At Risk (12/06/2022)  Tobacco Use: Medium Risk (12/06/2022)   SDOH Interventions:     Readmission Risk Interventions     No data to display

## 2022-12-08 LAB — CBC
HCT: 32.1 % — ABNORMAL LOW (ref 36.0–46.0)
Hemoglobin: 10.8 g/dL — ABNORMAL LOW (ref 12.0–15.0)
MCH: 30.3 pg (ref 26.0–34.0)
MCHC: 33.6 g/dL (ref 30.0–36.0)
MCV: 89.9 fL (ref 80.0–100.0)
Platelets: 315 10*3/uL (ref 150–400)
RBC: 3.57 MIL/uL — ABNORMAL LOW (ref 3.87–5.11)
RDW: 12.9 % (ref 11.5–15.5)
WBC: 4.8 10*3/uL (ref 4.0–10.5)
nRBC: 0 % (ref 0.0–0.2)

## 2022-12-08 LAB — BASIC METABOLIC PANEL
Anion gap: 8 (ref 5–15)
BUN: 18 mg/dL (ref 8–23)
CO2: 29 mmol/L (ref 22–32)
Calcium: 8.5 mg/dL — ABNORMAL LOW (ref 8.9–10.3)
Chloride: 103 mmol/L (ref 98–111)
Creatinine, Ser: 0.75 mg/dL (ref 0.44–1.00)
GFR, Estimated: >60 mL/min (ref >60)
Glucose, Bld: 144 mg/dL — ABNORMAL HIGH (ref 70–99)
Potassium: 3.6 mmol/L (ref 3.5–5.1)
Sodium: 140 mmol/L (ref 135–145)

## 2022-12-08 LAB — CULTURE, BLOOD (ROUTINE X 2)
Special Requests: ADEQUATE
Special Requests: ADEQUATE

## 2022-12-08 LAB — MAGNESIUM: Magnesium: 2.1 mg/dL (ref 1.7–2.4)

## 2022-12-08 MED ORDER — IPRATROPIUM-ALBUTEROL 0.5-2.5 (3) MG/3ML IN SOLN
3.0000 mL | Freq: Two times a day (BID) | RESPIRATORY_TRACT | Status: DC
  Administered 2022-12-08 – 2022-12-09 (×3): 3 mL via RESPIRATORY_TRACT
  Filled 2022-12-08 (×3): qty 3

## 2022-12-08 MED ORDER — ACETYLCYSTEINE 20 % IN SOLN
4.0000 mL | Freq: Two times a day (BID) | RESPIRATORY_TRACT | Status: DC
  Administered 2022-12-08: 4 mL via RESPIRATORY_TRACT
  Filled 2022-12-08 (×3): qty 4

## 2022-12-08 MED ORDER — GUAIFENESIN-DM 100-10 MG/5ML PO SYRP
10.0000 mL | ORAL_SOLUTION | Freq: Four times a day (QID) | ORAL | Status: DC | PRN
  Administered 2022-12-08: 10 mL via ORAL
  Filled 2022-12-08: qty 10

## 2022-12-08 NOTE — Progress Notes (Signed)
  PROGRESS NOTE    Vickie Davis  UUV:[id number] DOB: 1961-06-29 DOA: 12/05/2022 PCP: System, Provider Not In  247A/247A-AA  LOS: 3 days   Brief hospital course:   Assessment & Plan: Vickie Davis is a 61 y.o. female with medical history significant for COPD on 2L at baseline who presents to the ED by EMS for evaluation of cough and shortness of breath that began a week prior and progressively worsened.  Symptoms were not improved with home bronchodilator treatments.  She denies chest pain, fever or chills.  On arrival of EMS she was in acute respiratory distress with O2 sat 84% on room air requiring CPAP for transport.  She received DuoNebs, Solu-Medrol as well as Nitropaste and route.    Sepsis (HCC) Patient with fever, tachycardia, tachypnea, leukocytosis and influenza A Sepsis fluids  Acute on chronic respiratory failure with hypoxia 2L O2 at baseline --contributed by Flu A, COPD, and pulm edema (as seen on CXR). --oxygen requirement up to 5L Plan: --treat underlying causes --Continue supplemental O2 to keep sats >=90%, wean as tolerated  Influenza A --started Tamiflu on admission --Hold off on cefepime and azithromycin started in the ED as this is viral --Antitussives, incentive spirometry and supportive care --cont Tamiflu  Pulm edema --unknown chronicity.  CXR showed Increased interstitial markings. --cont IV lasix 40 mg BID  COPD with acute exacerbation (HCC) --cont prednisone 40 mg daily --cont home daily bronchodilators  --add Mucomyst neb BID   DVT prophylaxis: Lovenox SQ Code Status: Full code  Family Communication:  Level of care: Med-Surg Dispo:   The patient is from: home Anticipated d/c is to: home Anticipated d/c date is: tomorrow   Subjective and Interval History:  Pt reported breathing much improved after starting steroid.   Objective: Vitals:   12/08/22 0806 12/08/22 0828 12/08/22 1124 12/08/22 1353  BP: 115/73  108/73   Pulse: 88  92    Resp: 18  18   Temp:   98.1 F (36.7 C)   TempSrc:   Oral   SpO2: 94% 94% 92% 92%  Weight:      Height:        Intake/Output Summary (Last 24 hours) at 12/08/2022 1537 Last data filed at 12/08/2022 1442 Gross per 24 hour  Intake 720 ml  Output 2400 ml  Net -1680 ml   Filed Weights   12/05/22 1759  Weight: 95 kg    Examination:   Constitutional: NAD, AAOx3 HEENT: conjunctivae and lids normal, EOMI CV: No cyanosis.   RESP: normal respiratory effort, on 4L Extremities: No effusions, edema in BLE SKIN: warm, dry Neuro: II - XII grossly intact.   Psych: Normal mood and affect.  Appropriate judgement and reason   Data Reviewed: I have personally reviewed labs and imaging studies  Time spent: 35 minutes  Darlin Priestly, MD [facility] If 7PM-7AM, please contact night-coverage 12/08/2022, 3:37 PM

## 2022-12-08 NOTE — Progress Notes (Signed)
Physical Therapy Treatment Patient Details Name: Vickie Davis MRN: [id number] DOB: [birth year] Today's Date: 12/08/2022   History of Present Illness Vickie Davis is a 61 y.o. female with medical history significant for COPD who presents to the ED by EMS for evaluation of cough and shortness of breath that began a week prior and progressively worsened. Influenza A PCR positive.    PT Comments    Pt was standing EOB with RN upon arriving. She endorses MD just decrease O2 to 3 L from 4L. Pt is A and O x 4 and motivated to get better. She easily and safely ambulated in room without AD however did drop several time to 86%  sao2. Increased back to 4 L o2 and pt was able to maintain > 89%. She currently is refusing all post acute follow up therapy even though acute OPT continues to recommend to improve strength, activity tolerance, and safety with ADLs. Will continue to follow until DC home.   Recommendations for follow up therapy are one component of a multi-disciplinary discharge planning process, led by the attending physician.  Recommendations may be updated based on patient status, additional functional criteria and insurance authorization.  Follow Up Recommendations  Home health PT (pt currently does not feel she wants HH services at DC)     Assistance Recommended at Discharge Set up Supervision/Assistance  Patient can return home with the following Help with stairs or ramp for entrance   Equipment Recommendations  None recommended by PT       Precautions / Restrictions Precautions Precautions: None;Other (comment) (Isolation/droplet) Restrictions Weight Bearing Restrictions: No     Mobility  Bed Mobility Overal bed mobility: Independent     Transfers Overall transfer level: Modified independent     Ambulation/Gait Ambulation/Gait assistance: Supervision Gait Distance (Feet): 75 Feet Assistive device: None Gait Pattern/deviations: Step-through pattern Gait velocity:  decreased     General Gait Details: pt did not want to ambulate out of room but did perform ~ 5 laps in room. On 3L but required increase to 4 L to maintain > 88%. " the MD dropped me down to 3 L about 5 minutes ago     Balance Overall balance assessment: Modified Independent       Cognition Arousal/Alertness: Awake/alert Behavior During Therapy: WFL for tasks assessed/performed Overall Cognitive Status: Within Functional Limits for tasks assessed      General Comments: pt is A and O x 4               Pertinent Vitals/Pain Pain Assessment Pain Assessment: No/denies pain     PT Goals (current goals can now be found in the care plan section) Acute Rehab PT Goals Patient Stated Goal: go home Progress towards PT goals: Progressing toward goals    Frequency    Min 2X/week      PT Plan Current plan remains appropriate       AM-PAC PT "6 Clicks" Mobility   Outcome Measure  Help needed turning from your back to your side while in a flat bed without using bedrails?: None Help needed moving from lying on your back to sitting on the side of a flat bed without using bedrails?: None Help needed moving to and from a bed to a chair (including a wheelchair)?: None Help needed standing up from a chair using your arms (e.g., wheelchair or bedside chair)?: None Help needed to walk in hospital room?: A Little Help needed climbing 3-5 steps with a railing? : A  Little 6 Click Score: 22    End of Session Equipment Utilized During Treatment: Oxygen (4L o2) Activity Tolerance: Patient limited by fatigue Patient left: in chair;with call bell/phone within reach Nurse Communication: Mobility status PT Visit Diagnosis: Muscle weakness (generalized) (M62.81);Difficulty in walking, not elsewhere classified (R26.2);Unsteadiness on feet (R26.81)     Time: 8590-9311 PT Time Calculation (min) (ACUTE ONLY): 15 min  Charges:  $Gait Training: 8-22 mins                    Jetta Lout PTA 12/08/22, 2:08 PM

## 2022-12-09 LAB — BASIC METABOLIC PANEL
Anion gap: 6 (ref 5–15)
BUN: 16 mg/dL (ref 8–23)
CO2: 34 mmol/L — ABNORMAL HIGH (ref 22–32)
Calcium: 8.7 mg/dL — ABNORMAL LOW (ref 8.9–10.3)
Chloride: 100 mmol/L (ref 98–111)
Creatinine, Ser: 0.77 mg/dL (ref 0.44–1.00)
GFR, Estimated: >60 mL/min (ref >60)
Glucose, Bld: 94 mg/dL (ref 70–99)
Potassium: 3.2 mmol/L — ABNORMAL LOW (ref 3.5–5.1)
Sodium: 140 mmol/L (ref 135–145)

## 2022-12-09 LAB — CBC
HCT: 34 % — ABNORMAL LOW (ref 36.0–46.0)
Hemoglobin: 11.3 g/dL — ABNORMAL LOW (ref 12.0–15.0)
MCH: 29.8 pg (ref 26.0–34.0)
MCHC: 33.2 g/dL (ref 30.0–36.0)
MCV: 89.7 fL (ref 80.0–100.0)
Platelets: 326 10*3/uL (ref 150–400)
RBC: 3.79 MIL/uL — ABNORMAL LOW (ref 3.87–5.11)
RDW: 12.7 % (ref 11.5–15.5)
WBC: 5.4 10*3/uL (ref 4.0–10.5)
nRBC: 0 % (ref 0.0–0.2)

## 2022-12-09 LAB — MAGNESIUM: Magnesium: 1.9 mg/dL (ref 1.7–2.4)

## 2022-12-09 MED ORDER — OSELTAMIVIR PHOSPHATE 75 MG PO CAPS: 75.0000 mg | ORAL_CAPSULE | Freq: Two times a day (BID) | ORAL | 0 refills | Status: DC

## 2022-12-09 MED ORDER — PREDNISONE 20 MG PO TABS: 40.0000 mg | ORAL_TABLET | Freq: Every day | ORAL | 0 refills | Status: DC

## 2022-12-09 MED ORDER — POTASSIUM CHLORIDE CRYS ER 20 MEQ PO TBCR
40.0000 meq | EXTENDED_RELEASE_TABLET | Freq: Once | ORAL | Status: AC
  Administered 2022-12-09: 40 meq via ORAL
  Filled 2022-12-09: qty 2

## 2022-12-09 MED ORDER — OSELTAMIVIR PHOSPHATE 75 MG PO CAPS: 75.0000 mg | ORAL_CAPSULE | Freq: Two times a day (BID) | ORAL | 0 refills | Status: AC

## 2022-12-09 MED ORDER — PREDNISONE 20 MG PO TABS: 40.0000 mg | ORAL_TABLET | Freq: Every day | ORAL | 0 refills | Status: AC

## 2022-12-09 MED ORDER — GUAIFENESIN-DM 100-10 MG/5ML PO SYRP: 10.0000 mL | ORAL_SOLUTION | Freq: Four times a day (QID) | ORAL | 0 refills | Status: DC | PRN

## 2022-12-09 NOTE — Discharge Summary (Signed)
Physician Discharge Summary   Vickie Davis  female DOB: 1961/08/13  ZOX:[id number]  PCP: System, Provider Not In  Admit date: 12/05/2022 Discharge date: 12/09/2022  Admitted From: home Disposition:  home CODE STATUS: Full code   Hospital Course:  For full details, please see H&P, progress notes, consult notes and ancillary notes.  Briefly,  Vickie Davis is a 60 y.o. female with medical history significant for COPD on 2L at baseline who presented to the ED by EMS for evaluation of cough and shortness of breath that began a week prior and progressively worsened.   Symptoms were not improved with home bronchodilator treatments.  On arrival of EMS she was in acute respiratory distress with O2 sat 84% on room air requiring CPAP for transport.  She received DuoNebs, Solu-Medrol as well as Nitropaste and route.    Sepsis (HCC) Patient with fever, tachycardia, tachypnea, leukocytosis and influenza A   Acute on chronic respiratory failure with hypoxia 2L O2 at baseline --contributed by Flu A, COPD, and pulm edema (as seen on CXR). --oxygen requirement up to 5L --treated underlying causes --Pt was back on home 2L prior to discharge.   Influenza A --started Tamiflu on admission, and discharged to finish 2 more days at home. --Hold off on cefepime and azithromycin started in the ED as this is viral --Antitussives, incentive spirometry and supportive care   Pulm edema --unknown chronicity.  CXR showed Increased interstitial markings. --received IV lasix 40 mg BID while inpatient.   COPD with acute exacerbation (HCC) --started on prednisone 40 mg daily, discharged to finish 2 more days to finish steroid burst. --cont home daily bronchodilators    Discharge Diagnoses:  Principal Problem:   Influenza A Active Problems:   Sepsis (HCC)   COPD with acute exacerbation (HCC)   Acute respiratory failure with hypoxia (HCC)   30 Day Unplanned Readmission Risk Score    Flowsheet  Row ED to Hosp-Admission (Current) from 12/05/2022 in [facility]  30 Day Unplanned Readmission Risk Score (%) 13.39 Filed at 12/09/2022 0801       This score is the patient's risk of an unplanned readmission within 30 days of being discharged (0 -100%). The score is based on dignosis, age, lab data, medications, orders, and past utilization.   Low:  0-14.9   Medium: 15-21.9   High: 22-29.9   Extreme: 30 and above         Discharge Instructions:  Allergies as of 12/09/2022       Reactions   Sulfa Antibiotics Hives   Eggs Or Egg-derived Products Itching   No mayonnaise or salad dressings made with eggs either   Other         Medication List     TAKE these medications    atorvastatin 40 MG tablet Commonly known as: LIPITOR Take 40 mg by mouth daily.   Dulera 200-5 MCG/ACT Aero Generic drug: mometasone-formoterol Inhale 2 puffs into the lungs 2 (two) times daily.   fluticasone 50 MCG/ACT nasal spray Commonly known as: FLONASE Place 2 sprays into both nostrils daily.   guaiFENesin-dextromethorphan 100-10 MG/5ML syrup Commonly known as: ROBITUSSIN DM Take 10 mLs by mouth every 6 (six) hours as needed for cough.   montelukast 10 MG tablet Commonly known as: SINGULAIR Take 10 mg by mouth at bedtime.   oseltamivir 75 MG capsule Commonly known as: TAMIFLU Take 1 capsule (75 mg total) by mouth 2 (two) times daily for 2 days.  predniSONE 20 MG tablet Commonly known as: DELTASONE Take 2 tablets (40 mg total) by mouth daily with breakfast for 2 days. Start taking on: December 10, 2022   Spiriva Respimat 2.5 MCG/ACT Aers Generic drug: Tiotropium Bromide Monohydrate Inhale 2 each into the lungs daily.   traZODone 100 MG tablet Commonly known as: DESYREL Take 100 mg by mouth at bedtime.   Ventolin HFA 108 (90 Base) MCG/ACT inhaler Generic drug: albuterol Inhale 2 puffs into the lungs every 4 (four) hours as needed for wheezing or shortness  of breath.          Allergies  Allergen Reactions   Sulfa Antibiotics Hives   Eggs Or Egg-Derived Products Itching    No mayonnaise or salad dressings made with eggs either   Other      The results of significant diagnostics from this hospitalization (including imaging, microbiology, ancillary and laboratory) are listed below for reference.   Consultations:   Procedures/Studies: DG Chest Portable 1 View  Result Date: 12/05/2022 CLINICAL DATA:  Respiratory distress EXAM: PORTABLE CHEST 1 VIEW COMPARISON:  Previous studies including the examination of 08/07/2021 FINDINGS: Cardiac size is within normal limits. Central pulmonary vessels are more prominent. Increased interstitial markings are seen in both parahilar regions and both lower lung fields, more so on the left side. There is no focal consolidation. Costophrenic angles are clear. There is no pneumothorax. There are multiple old healed fractures on both sides. IMPRESSION: Central pulmonary vessels are more prominent suggesting CHF. Increased interstitial markings are seen in mid and lower lung fields, more so on the left side suggesting interstitial edema or interstitial pneumonia. Electronically Signed   By: Ernie Avena M.D.   On: 12/05/2022 18:59      Labs: BNP (last 3 results) No results for input(s): "BNP" in the last 8760 hours. Basic Metabolic Panel: Recent Labs  Lab 12/05/22 1801 12/07/22 0642 12/08/22 0547 12/09/22 0635  NA 133* 139 140 140  K 3.6 3.2* 3.6 3.2*  CL 96* 103 103 100  CO2 22 27 29  34*  GLUCOSE 171* 107* 144* 94  BUN 8 13 18 16   CREATININE 0.87 0.78 0.75 0.77  CALCIUM 8.8* 8.5* 8.5* 8.7*  MG  --  1.9 2.1 1.9   Liver Function Tests: Recent Labs  Lab 12/05/22 1801  AST 27  ALT 15  ALKPHOS 96  BILITOT 1.6*  PROT 8.1  ALBUMIN 3.6   No results for input(s): "LIPASE", "AMYLASE" in the last 168 hours. No results for input(s): "AMMONIA" in the last 168 hours. CBC: Recent Labs   Lab 12/05/22 1801 12/07/22 0642 12/08/22 0547 12/09/22 0635  WBC 12.3* 9.1 4.8 5.4  NEUTROABS 10.8*  --   --   --   HGB 12.4 10.8* 10.8* 11.3*  HCT 36.7 32.5* 32.1* 34.0*  MCV 89.3 89.8 89.9 89.7  PLT 352 318 315 326   Cardiac Enzymes: No results for input(s): "CKTOTAL", "CKMB", "CKMBINDEX", "TROPONINI" in the last 168 hours. BNP: Invalid input(s): "POCBNP" CBG: No results for input(s): "GLUCAP" in the last 168 hours. D-Dimer No results for input(s): "DDIMER" in the last 72 hours. Hgb A1c No results for input(s): "HGBA1C" in the last 72 hours. Lipid Profile No results for input(s): "CHOL", "HDL", "LDLCALC", "TRIG", "CHOLHDL", "LDLDIRECT" in the last 72 hours. Thyroid function studies No results for input(s): "TSH", "T4TOTAL", "T3FREE", "THYROIDAB" in the last 72 hours.  Invalid input(s): "FREET3" Anemia work up No results for input(s): "VITAMINB12", "FOLATE", "FERRITIN", "TIBC", "IRON", "RETICCTPCT" in the  last 72 hours. Urinalysis    Component Value Date/Time   COLORURINE STRAW (A) 12/05/2022 2002   APPEARANCEUR CLEAR (A) 12/05/2022 2002   LABSPEC 1.005 12/05/2022 2002   PHURINE 6.0 12/05/2022 2002   GLUCOSEU NEGATIVE 12/05/2022 2002   HGBUR MODERATE (A) 12/05/2022 2002   BILIRUBINUR NEGATIVE 12/05/2022 2002   KETONESUR 20 (A) 12/05/2022 2002   PROTEINUR NEGATIVE 12/05/2022 2002   NITRITE NEGATIVE 12/05/2022 2002   LEUKOCYTESUR NEGATIVE 12/05/2022 2002   Sepsis Labs Recent Labs  Lab 12/05/22 1801 12/07/22 0642 12/08/22 0547 12/09/22 0635  WBC 12.3* 9.1 4.8 5.4   Microbiology Recent Results (from the past 240 hour(s))  Resp panel by RT-PCR (RSV, Flu A&B, Covid) Anterior Nasal Swab     Status: Abnormal   Collection Time: 12/05/22  6:00 PM   Specimen: Anterior Nasal Swab  Result Value Ref Range Status   SARS Coronavirus 2 by RT PCR NEGATIVE NEGATIVE Final    Comment: (NOTE) SARS-CoV-2 target nucleic acids are NOT DETECTED.  The SARS-CoV-2 RNA is  generally detectable in upper respiratory specimens during the acute phase of infection. The lowest concentration of SARS-CoV-2 viral copies this assay can detect is 138 copies/mL. A negative result does not preclude SARS-Cov-2 infection and should not be used as the sole basis for treatment or other patient management decisions. A negative result may occur with  improper specimen collection/handling, submission of specimen other than nasopharyngeal swab, presence of viral mutation(s) within the areas targeted by this assay, and inadequate number of viral copies(<138 copies/mL). A negative result must be combined with clinical observations, patient history, and epidemiological information. The expected result is Negative.  Fact Sheet for Patients:  BloggerCourse.com  Fact Sheet for Healthcare Providers:  SeriousBroker.it  This test is no t yet approved or cleared by the [place] FDA and  has been authorized for detection and/or diagnosis of SARS-CoV-2 by FDA under an Emergency Use Authorization (EUA). This EUA will remain  in effect (meaning this test can be used) for the duration of the COVID-19 declaration under Section 564(b)(1) of the Act, 21 U.S.C.section 360bbb-3(b)(1), unless the authorization is terminated  or revoked sooner.       Influenza A by PCR POSITIVE (A) NEGATIVE Final   Influenza B by PCR NEGATIVE NEGATIVE Final    Comment: (NOTE) The Xpert Xpress SARS-CoV-2/FLU/RSV plus assay is intended as an aid in the diagnosis of influenza from Nasopharyngeal swab specimens and should not be used as a sole basis for treatment. Nasal washings and aspirates are unacceptable for Xpert Xpress SARS-CoV-2/FLU/RSV testing.  Fact Sheet for Patients: BloggerCourse.com  Fact Sheet for Healthcare Providers: SeriousBroker.it  This test is not yet approved or cleared by the  [place] FDA and has been authorized for detection and/or diagnosis of SARS-CoV-2 by FDA under an Emergency Use Authorization (EUA). This EUA will remain in effect (meaning this test can be used) for the duration of the COVID-19 declaration under Section 564(b)(1) of the Act, 21 U.S.C. section 360bbb-3(b)(1), unless the authorization is terminated or revoked.     Resp Syncytial Virus by PCR NEGATIVE NEGATIVE Final    Comment: (NOTE) Fact Sheet for Patients: BloggerCourse.com  Fact Sheet for Healthcare Providers: SeriousBroker.it  This test is not yet approved or cleared by the [place] FDA and has been authorized for detection and/or diagnosis of SARS-CoV-2 by FDA under an Emergency Use Authorization (EUA). This EUA will remain in effect (meaning this test can be used) for the duration of the  COVID-19 declaration under Section 564(b)(1) of the Act, 21 U.S.C. section 360bbb-3(b)(1), unless the authorization is terminated or revoked.  Performed at [facility], [address] [place], [place] [address]   Blood Culture (routine x 2)     Status: Abnormal   Collection Time: 12/05/22  6:25 PM   Specimen: BLOOD LEFT HAND  Result Value Ref Range Status   Specimen Description   Final    BLOOD LEFT HAND Performed at [facility], [address], [place], [place] [address]    Special Requests   Final    BOTTLES DRAWN AEROBIC AND ANAEROBIC Blood Culture adequate volume Performed at [facility], [address] [place], [place] [address]    Culture  Setup Time   Final    GRAM POSITIVE COCCI ANAEROBIC BOTTLE ONLY Organism ID to follow CRITICAL RESULT CALLED TO, READ BACK BY AND VERIFIED WITH: Kendrick Ranch 12/06/22 1217 MW    Culture STAPHYLOCOCCUS HOMINIS (A)  Final   Report Status 12/08/2022 FINAL  Final   Organism ID, Bacteria STAPHYLOCOCCUS HOMINIS  Final      Susceptibility   Staphylococcus  hominis - MIC*    CIPROFLOXACIN <=0.5 SENSITIVE Sensitive     ERYTHROMYCIN >=8 RESISTANT Resistant     GENTAMICIN <=0.5 SENSITIVE Sensitive     OXACILLIN 2 RESISTANT Resistant     TETRACYCLINE <=1 SENSITIVE Sensitive     VANCOMYCIN 1 SENSITIVE Sensitive     TRIMETH/SULFA <=10 SENSITIVE Sensitive     CLINDAMYCIN <=0.25 SENSITIVE Sensitive     RIFAMPIN <=0.5 SENSITIVE Sensitive     Inducible Clindamycin NEGATIVE Sensitive     * STAPHYLOCOCCUS HOMINIS  Blood Culture (routine x 2)     Status: Abnormal   Collection Time: 12/05/22  6:25 PM   Specimen: BLOOD  Result Value Ref Range Status   Specimen Description   Final    BLOOD BLOOD RIGHT ARM Performed at [facility], [address] [place], [place] [address]    Special Requests   Final    BOTTLES DRAWN AEROBIC AND ANAEROBIC Blood Culture adequate volume Performed at [facility], [address] [place], [place] [address]    Culture  Setup Time   Final    GRAM POSITIVE COCCI AEROBIC BOTTLE ONLY CRITICAL VALUE NOTED.  VALUE IS CONSISTENT WITH PREVIOUSLY REPORTED AND CALLED VALUE.    Culture (A)  Final    STAPHYLOCOCCUS HOMINIS SUSCEPTIBILITIES PERFORMED ON PREVIOUS CULTURE WITHIN THE LAST 5 DAYS. Performed at [facility], [address] [place], [place] [address]    Report Status 12/08/2022 FINAL  Final  Blood Culture ID Panel (Reflexed)     Status: Abnormal   Collection Time: 12/05/22  6:25 PM  Result Value Ref Range Status   Enterococcus faecalis NOT DETECTED NOT DETECTED Final   Enterococcus Faecium NOT DETECTED NOT DETECTED Final   Listeria monocytogenes NOT DETECTED NOT DETECTED Final   Staphylococcus species DETECTED (A) NOT DETECTED Final    Comment: CRITICAL RESULT CALLED TO, READ BACK BY AND VERIFIED WITH: Kendrick Ranch 12/06/22 1217 MW    Staphylococcus aureus (BCID) NOT DETECTED NOT DETECTED Final   Staphylococcus epidermidis NOT DETECTED NOT DETECTED Final   Staphylococcus lugdunensis  NOT DETECTED NOT DETECTED Final   Streptococcus species NOT DETECTED NOT DETECTED Final   Streptococcus agalactiae NOT DETECTED NOT DETECTED Final   Streptococcus pneumoniae NOT DETECTED NOT DETECTED Final   Streptococcus pyogenes NOT DETECTED NOT DETECTED Final   A.calcoaceticus-baumannii  NOT DETECTED NOT DETECTED Final   Bacteroides fragilis NOT DETECTED NOT DETECTED Final   Enterobacterales NOT DETECTED NOT DETECTED Final   Enterobacter cloacae complex NOT DETECTED NOT DETECTED Final   Escherichia coli NOT DETECTED NOT DETECTED Final   Klebsiella aerogenes NOT DETECTED NOT DETECTED Final   Klebsiella oxytoca NOT DETECTED NOT DETECTED Final   Klebsiella pneumoniae NOT DETECTED NOT DETECTED Final   Proteus species NOT DETECTED NOT DETECTED Final   Salmonella species NOT DETECTED NOT DETECTED Final   Serratia marcescens NOT DETECTED NOT DETECTED Final   Haemophilus influenzae NOT DETECTED NOT DETECTED Final   Neisseria meningitidis NOT DETECTED NOT DETECTED Final   Pseudomonas aeruginosa NOT DETECTED NOT DETECTED Final   Stenotrophomonas maltophilia NOT DETECTED NOT DETECTED Final   Candida albicans NOT DETECTED NOT DETECTED Final   Candida auris NOT DETECTED NOT DETECTED Final   Candida glabrata NOT DETECTED NOT DETECTED Final   Candida krusei NOT DETECTED NOT DETECTED Final   Candida parapsilosis NOT DETECTED NOT DETECTED Final   Candida tropicalis NOT DETECTED NOT DETECTED Final   Cryptococcus neoformans/gattii NOT DETECTED NOT DETECTED Final    Comment: Performed at [facility], [address] [place], [place] [address]  Culture, blood (Routine X 2) w Reflex to ID Panel     Status: None (Preliminary result)   Collection Time: 12/07/22  9:51 AM   Specimen: BLOOD  Result Value Ref Range Status   Specimen Description BLOOD BLOOD LEFT ARM  Final   Special Requests   Final    BOTTLES DRAWN AEROBIC AND ANAEROBIC Blood Culture adequate volume   Culture   Final    NO  GROWTH 2 DAYS Performed at [facility], [address], [place], [place] [address]    Report Status PENDING  Incomplete  Culture, blood (Routine X 2) w Reflex to ID Panel     Status: None (Preliminary result)   Collection Time: 12/07/22  9:51 AM   Specimen: BLOOD  Result Value Ref Range Status   Specimen Description BLOOD LEFT FOREARM  Final   Special Requests   Final    BOTTLES DRAWN AEROBIC ONLY Blood Culture results may not be optimal due to an inadequate volume of blood received in culture bottles   Culture   Final    NO GROWTH 2 DAYS Performed at [facility], [address], [place], [place] [address]    Report Status PENDING  Incomplete     Total time spend on discharging this patient, including the last patient exam, discussing the hospital stay, instructions for ongoing care as it relates to all pertinent caregivers, as well as preparing the medical discharge records, prescriptions, and/or referrals as applicable, is 30 minutes.    Darlin Priestly, MD  [facility] 12/09/2022, 11:31 AM

## 2022-12-09 NOTE — Progress Notes (Signed)
Discussed discharge order with patient and family, including medications and follow up appointments.      Adjusted pharmacy to accommodate holiday to [facility] on [address].

## 2022-12-12 LAB — CULTURE, BLOOD (ROUTINE X 2)
Culture: NO GROWTH
Culture: NO GROWTH
Special Requests: ADEQUATE

## 2025-01-15 ENCOUNTER — Emergency Department

## 2025-01-15 ENCOUNTER — Observation Stay
Admission: EM | Admit: 2025-01-15 | Discharge: 2025-01-18 | DRG: 189 | Disposition: A | Attending: Internal Medicine | Admitting: Internal Medicine

## 2025-01-15 DIAGNOSIS — J441 Chronic obstructive pulmonary disease with (acute) exacerbation: Secondary | ICD-10-CM | POA: Diagnosis present

## 2025-01-15 DIAGNOSIS — Z6833 Body mass index (BMI) 33.0-33.9, adult: Secondary | ICD-10-CM

## 2025-01-15 DIAGNOSIS — Z716 Tobacco abuse counseling: Secondary | ICD-10-CM

## 2025-01-15 DIAGNOSIS — J9602 Acute respiratory failure with hypercapnia: Principal | ICD-10-CM

## 2025-01-15 DIAGNOSIS — Z9981 Dependence on supplemental oxygen: Secondary | ICD-10-CM

## 2025-01-15 DIAGNOSIS — J9621 Acute and chronic respiratory failure with hypoxia: Secondary | ICD-10-CM | POA: Diagnosis present

## 2025-01-15 DIAGNOSIS — Z1152 Encounter for screening for COVID-19: Secondary | ICD-10-CM

## 2025-01-15 DIAGNOSIS — F17200 Nicotine dependence, unspecified, uncomplicated: Secondary | ICD-10-CM | POA: Diagnosis present

## 2025-01-15 DIAGNOSIS — J9622 Acute and chronic respiratory failure with hypercapnia: Principal | ICD-10-CM | POA: Diagnosis present

## 2025-01-15 DIAGNOSIS — Z7951 Long term (current) use of inhaled steroids: Secondary | ICD-10-CM

## 2025-01-15 DIAGNOSIS — R000 Tachycardia, unspecified: Secondary | ICD-10-CM | POA: Diagnosis not present

## 2025-01-15 DIAGNOSIS — Z79899 Other long term (current) drug therapy: Secondary | ICD-10-CM

## 2025-01-15 DIAGNOSIS — J988 Other specified respiratory disorders: Secondary | ICD-10-CM

## 2025-01-15 DIAGNOSIS — E66811 Obesity, class 1: Secondary | ICD-10-CM | POA: Diagnosis present

## 2025-01-15 HISTORY — DX: Chronic obstructive pulmonary disease, unspecified: J44.9

## 2025-01-15 LAB — BLOOD GAS, VENOUS
Acid-base deficit: 7.3 mmol/L — ABNORMAL HIGH (ref 0.0–2.0)
Bicarbonate: 19.3 mmol/L — ABNORMAL LOW (ref 20.0–28.0)
O2 Saturation: 89.6 %
Patient temperature: 37
pCO2, Ven: 42 mmHg — ABNORMAL LOW (ref 44–60)
pH, Ven: 7.27 (ref 7.25–7.43)
pO2, Ven: 55 mmHg — ABNORMAL HIGH (ref 32–45)

## 2025-01-15 NOTE — ED Triage Notes (Signed)
 Pt here via EMS from home for SOB that started a few days ago, but got worsened. Notes hx of COPD, uncertain of anything else. Pt self administered 4-6 breathing treatments at home, EMS gave 3 more duo nebs, Mag, and Solumedrol. Pt is tachycardic, tachypneic, and has significant WOB. Denies recent illness/N/V/D/fever/CP. Pt looks more comfortable on CPAP.

## 2025-01-16 LAB — COMPREHENSIVE METABOLIC PANEL WITH GFR
ALT: 16 U/L (ref 0–44)
AST: 22 U/L (ref 15–41)
Albumin: 3.7 g/dL (ref 3.5–5.0)
Alkaline Phosphatase: 79 U/L (ref 38–126)
Anion gap: 13 (ref 5–15)
BUN: 10 mg/dL (ref 8–23)
CO2: 19 mmol/L — ABNORMAL LOW (ref 22–32)
Calcium: 7.6 mg/dL — ABNORMAL LOW (ref 8.9–10.3)
Chloride: 108 mmol/L (ref 98–111)
Creatinine, Ser: 0.62 mg/dL (ref 0.44–1.00)
GFR, Estimated: >60 mL/min
Glucose, Bld: 208 mg/dL — ABNORMAL HIGH (ref 70–99)
Potassium: 3.6 mmol/L (ref 3.5–5.1)
Sodium: 140 mmol/L (ref 135–145)
Total Bilirubin: 0.4 mg/dL (ref 0.0–1.2)
Total Protein: 6.1 g/dL — ABNORMAL LOW (ref 6.5–8.1)

## 2025-01-16 LAB — BLOOD GAS, VENOUS

## 2025-01-16 LAB — CBC WITH DIFFERENTIAL/PLATELET
Abs Immature Granulocytes: 0.03 10*3/uL (ref 0.00–0.07)
Basophils Absolute: 0 10*3/uL (ref 0.0–0.1)
Basophils Relative: 0 %
Eosinophils Absolute: 0.2 10*3/uL (ref 0.0–0.5)
Eosinophils Relative: 2 %
HCT: 29.1 % — ABNORMAL LOW (ref 36.0–46.0)
Hemoglobin: 9.2 g/dL — ABNORMAL LOW (ref 12.0–15.0)
Immature Granulocytes: 0 %
Lymphocytes Relative: 22 %
Lymphs Abs: 1.7 10*3/uL (ref 0.7–4.0)
MCH: 31.3 pg (ref 26.0–34.0)
MCHC: 31.6 g/dL (ref 30.0–36.0)
MCV: 99 fL (ref 80.0–100.0)
Monocytes Absolute: 0.5 10*3/uL (ref 0.1–1.0)
Monocytes Relative: 6 %
Neutro Abs: 5.4 10*3/uL (ref 1.7–7.7)
Neutrophils Relative %: 70 %
Platelets: 249 10*3/uL (ref 150–400)
RBC: 2.94 MIL/uL — ABNORMAL LOW (ref 3.87–5.11)
RDW: 13.3 % (ref 11.5–15.5)
WBC: 7.9 10*3/uL (ref 4.0–10.5)
nRBC: 0 % (ref 0.0–0.2)

## 2025-01-16 LAB — RESP PANEL BY RT-PCR (RSV, FLU A&B, COVID)  RVPGX2
Influenza A by PCR: NEGATIVE
Influenza B by PCR: NEGATIVE
Resp Syncytial Virus by PCR: NEGATIVE
SARS Coronavirus 2 by RT PCR: NEGATIVE

## 2025-01-16 LAB — PRO BRAIN NATRIURETIC PEPTIDE: Pro Brain Natriuretic Peptide: 161 pg/mL

## 2025-01-16 MED ORDER — ACETAMINOPHEN 650 MG RE SUPP: 650.0000 mg | Freq: Four times a day (QID) | RECTAL | Status: DC | PRN

## 2025-01-16 MED ORDER — ACETAMINOPHEN 325 MG PO TABS
650.0000 mg | ORAL_TABLET | Freq: Four times a day (QID) | ORAL | Status: DC | PRN
  Administered 2025-01-16 – 2025-01-18 (×3): 650 mg via ORAL
  Filled 2025-01-16 (×3): qty 2

## 2025-01-16 MED ORDER — IPRATROPIUM-ALBUTEROL 0.5-2.5 (3) MG/3ML IN SOLN
3.0000 mL | Freq: Four times a day (QID) | RESPIRATORY_TRACT | Status: DC
  Administered 2025-01-16 – 2025-01-17 (×6): 3 mL via RESPIRATORY_TRACT
  Filled 2025-01-16 (×6): qty 3

## 2025-01-16 MED ORDER — GUAIFENESIN ER 600 MG PO TB12
600.0000 mg | ORAL_TABLET | Freq: Two times a day (BID) | ORAL | Status: DC
  Administered 2025-01-16 – 2025-01-18 (×6): 600 mg via ORAL
  Filled 2025-01-16 (×6): qty 1

## 2025-01-16 MED ORDER — FLUTICASONE PROPIONATE 50 MCG/ACT NA SUSP
2.0000 | Freq: Every day | NASAL | Status: DC
  Administered 2025-01-18: 2 via NASAL
  Filled 2025-01-16: qty 16

## 2025-01-16 MED ORDER — ENOXAPARIN SODIUM 60 MG/0.6ML IJ SOSY
0.5000 mg/kg | PREFILLED_SYRINGE | INTRAMUSCULAR | Status: DC
  Administered 2025-01-16 – 2025-01-18 (×3): 47.5 mg via SUBCUTANEOUS
  Filled 2025-01-16 (×3): qty 0.6

## 2025-01-16 MED ORDER — SODIUM CHLORIDE 0.9 % IV SOLN: 100.0000 mg | Freq: Two times a day (BID) | INTRAVENOUS | Status: DC

## 2025-01-16 MED ORDER — PREDNISONE 20 MG PO TABS
40.0000 mg | ORAL_TABLET | Freq: Every day | ORAL | Status: DC
  Administered 2025-01-17: 40 mg via ORAL
  Filled 2025-01-16: qty 2

## 2025-01-16 MED ORDER — TRAZODONE HCL 100 MG PO TABS
100.0000 mg | ORAL_TABLET | Freq: Every evening | ORAL | Status: DC | PRN
  Administered 2025-01-16 – 2025-01-17 (×2): 100 mg via ORAL
  Filled 2025-01-16 (×3): qty 1

## 2025-01-16 MED ORDER — SODIUM CHLORIDE 0.9 % IV SOLN
1.0000 g | Freq: Once | INTRAVENOUS | Status: AC
  Administered 2025-01-16: 1 g via INTRAVENOUS
  Filled 2025-01-16: qty 10

## 2025-01-16 MED ORDER — ONDANSETRON HCL 4 MG/2ML IJ SOLN: 4.0000 mg | Freq: Four times a day (QID) | INTRAMUSCULAR | Status: DC | PRN

## 2025-01-16 MED ORDER — ONDANSETRON HCL 4 MG PO TABS: 4.0000 mg | ORAL_TABLET | Freq: Four times a day (QID) | ORAL | Status: DC | PRN

## 2025-01-16 MED ORDER — SODIUM CHLORIDE 0.9 % IV SOLN
1.0000 g | INTRAVENOUS | Status: DC
  Administered 2025-01-16 – 2025-01-18 (×2): 1 g via INTRAVENOUS
  Filled 2025-01-16 (×3): qty 10

## 2025-01-16 MED ORDER — ALBUTEROL SULFATE (2.5 MG/3ML) 0.083% IN NEBU: 2.5000 mg | INHALATION_SOLUTION | RESPIRATORY_TRACT | Status: DC | PRN

## 2025-01-16 MED ORDER — SODIUM CHLORIDE 0.9 % IV SOLN
100.0000 mg | Freq: Two times a day (BID) | INTRAVENOUS | Status: DC
  Administered 2025-01-16 – 2025-01-17 (×3): 100 mg via INTRAVENOUS
  Filled 2025-01-16 (×4): qty 100

## 2025-01-16 MED ORDER — ALBUTEROL SULFATE (2.5 MG/3ML) 0.083% IN NEBU
7.5000 mg | INHALATION_SOLUTION | Freq: Once | RESPIRATORY_TRACT | Status: AC
  Administered 2025-01-16: 7.5 mg via RESPIRATORY_TRACT
  Filled 2025-01-16: qty 9

## 2025-01-16 MED ORDER — METHYLPREDNISOLONE SODIUM SUCC 40 MG IJ SOLR
40.0000 mg | Freq: Two times a day (BID) | INTRAMUSCULAR | Status: AC
  Administered 2025-01-16 (×2): 40 mg via INTRAVENOUS
  Filled 2025-01-16 (×2): qty 1

## 2025-01-16 MED ORDER — HYDROCODONE-ACETAMINOPHEN 5-325 MG PO TABS: 1.0000 | ORAL_TABLET | ORAL | Status: DC | PRN

## 2025-01-16 NOTE — Progress Notes (Signed)
 " PROGRESS NOTE    Vickie Davis  FMW:[id number] DOB: August 27, 1961 DOA: 01/15/2025 PCP: System, Provider Not In  Chief Complaint  Patient presents with   Shortness of Breath    Hospital Course:  Vickie Davis is a 64 y.o. female with medical history significant for COPD on home O2 at 2 L presented with cough, wheezing not improving with home bronchodilators.  Admitted for COPD exacerbation with acute on chronic respiratory failure. Was placed on BiPAP on presentation.  Hospital course as below  Subjective: Patient was examined the bedside, new to me today. Reports she feels significantly better since presentation, continues to have dyspnea on exertion Will continue IV Solu-Medrol , DuoNebs and empiric antibiotics Anticipate will be ready to go home in 1 to 2-days PT eval   Objective: Vitals:   01/16/25 0100 01/16/25 0500 01/16/25 0958 01/16/25 0958  BP: 118/77 114/75  (!) 128/93  Pulse: (!) 113 97  (!) 104  Resp: 19 18  20   Temp: 98 F (36.7 C) 98.3 F (36.8 C) 98.3 F (36.8 C)   TempSrc: Oral Oral Axillary   SpO2: 99% 97%  97%  Weight:      Height:        Intake/Output Summary (Last 24 hours) at 01/16/2025 1002 Last data filed at 01/16/2025 0311 Gross per 24 hour  Intake 350 ml  Output --  Net 350 ml   Filed Weights   01/15/25 2247  Weight: 94.3 kg    Examination: Constitutional:      Comments: no acute distress, mildly inc WOB HENT:     Head: Normocephalic and atraumatic.  Cardiovascular:     Rate and Rhythm: Regular rhythm. Tachycardia present.     Heart sounds: Normal heart sounds.  Pulmonary:     Breath sounds: Wheezing and rhonchi present.  Abdominal:     Palpations: Abdomen is soft.     Tenderness: There is no abdominal tenderness.  Neurological:     Mental Status: Mental status is at baseline  Assessment & Plan:  COPD with acute exacerbation Acute on chronic respiratory failure with hypoxia Was on Bipap on presentation, weaned to 3L [place], at baseline  on 2L [place] Continue IV Solu-Medrol  Schedule and as needed nebulized bronchodilators Continue empiric antibiotics Rocephin  and doxycycline  Antitussives, flutter valve Wean Oxygen as able  Tobacco use Refused Nicotine pacthes/gums Smoking cessation counseled  Obesity class I Body mass index is 33.54 kg/m. - Outpatient follow up for lifestyle modification and risk factor management  PT eval  DVT prophylaxis: ***   Code Status: Full Code Disposition:  ***  Consultants:    Procedures:  ***  Antimicrobials:  Anti-infectives (From admission, onward)    Start     Dose/Rate Route Frequency Ordered Stop   01/17/25 0000  cefTRIAXone  (ROCEPHIN ) 1 g in sodium chloride  0.9 % 100 mL IVPB        1 g 200 mL/hr over 30 Minutes Intravenous Every 24 hours 01/16/25 0240     01/16/25 0245  doxycycline  (VIBRAMYCIN ) 100 mg in sodium chloride  0.9 % 250 mL IVPB  Status:  Discontinued        100 mg 125 mL/hr over 120 Minutes Intravenous Every 12 hours 01/16/25 0240 01/16/25 0241   01/16/25 0015  cefTRIAXone  (ROCEPHIN ) 1 g in sodium chloride  0.9 % 100 mL IVPB        1 g 200 mL/hr over 30 Minutes Intravenous  Once 01/16/25 0007 01/16/25 0044   01/16/25 0015  doxycycline  (VIBRAMYCIN ) 100  mg in sodium chloride  0.9 % 250 mL IVPB        100 mg 125 mL/hr over 120 Minutes Intravenous Every 12 hours 01/16/25 0007         Data Reviewed: I have personally reviewed following labs and imaging studies CBC: Recent Labs  Lab 01/16/25 0016  WBC 7.9  NEUTROABS 5.4  HGB 9.2*  HCT 29.1*  MCV 99.0  PLT 249   Basic Metabolic Panel: Recent Labs  Lab 01/16/25 0016  NA 140  K 3.6  CL 108  CO2 19*  GLUCOSE 208*  BUN 10  CREATININE 0.62  CALCIUM  7.6*   GFR: Estimated Creatinine Clearance: 81.1 mL/min (by C-G formula based on SCr of 0.62 mg/dL). Liver Function Tests: Recent Labs  Lab 01/16/25 0016  AST 22  ALT 16  ALKPHOS 79  BILITOT 0.4  PROT 6.1*  ALBUMIN 3.7   CBG: No results for  input(s): GLUCAP in the last 168 hours.  Recent Results (from the past 240 hours)  Resp panel by RT-PCR (RSV, Flu A&B, Covid) Anterior Nasal Swab     Status: None   Collection Time: 01/15/25 11:02 PM   Specimen: Anterior Nasal Swab  Result Value Ref Range Status   SARS Coronavirus 2 by RT PCR NEGATIVE NEGATIVE Final    Comment: (NOTE) SARS-CoV-2 target nucleic acids are NOT DETECTED.  The SARS-CoV-2 RNA is generally detectable in upper respiratory specimens during the acute phase of infection. The lowest concentration of SARS-CoV-2 viral copies this assay can detect is 138 copies/mL. A negative result does not preclude SARS-Cov-2 infection and should not be used as the sole basis for treatment or other patient management decisions. A negative result may occur with  improper specimen collection/handling, submission of specimen other than nasopharyngeal swab, presence of viral mutation(s) within the areas targeted by this assay, and inadequate number of viral copies(<138 copies/mL). A negative result must be combined with clinical observations, patient history, and epidemiological information. The expected result is Negative.  Fact Sheet for Patients:  bloggercourse.com  Fact Sheet for Healthcare Providers:  seriousbroker.it  This test is no t yet approved or cleared by the [place]  FDA and  has been authorized for detection and/or diagnosis of SARS-CoV-2 by FDA under an Emergency Use Authorization (EUA). This EUA will remain  in effect (meaning this test can be used) for the duration of the COVID-19 declaration under Section 564(b)(1) of the Act, 21 U.S.C.section 360bbb-3(b)(1), unless the authorization is terminated  or revoked sooner.       Influenza A by PCR NEGATIVE NEGATIVE Final   Influenza B by PCR NEGATIVE NEGATIVE Final    Comment: (NOTE) The Xpert Xpress SARS-CoV-2/FLU/RSV plus assay is intended as an aid in  the diagnosis of influenza from Nasopharyngeal swab specimens and should not be used as a sole basis for treatment. Nasal washings and aspirates are unacceptable for Xpert Xpress SARS-CoV-2/FLU/RSV testing.  Fact Sheet for Patients: bloggercourse.com  Fact Sheet for Healthcare Providers: seriousbroker.it  This test is not yet approved or cleared by the [place]  FDA and has been authorized for detection and/or diagnosis of SARS-CoV-2 by FDA under an Emergency Use Authorization (EUA). This EUA will remain in effect (meaning this test can be used) for the duration of the COVID-19 declaration under Section 564(b)(1) of the Act, 21 U.S.C. section 360bbb-3(b)(1), unless the authorization is terminated or revoked.     Resp Syncytial Virus by PCR NEGATIVE NEGATIVE Final    Comment: (NOTE) Fact Sheet  for Patients: bloggercourse.com  Fact Sheet for Healthcare Providers: seriousbroker.it  This test is not yet approved or cleared by the [place]  FDA and has been authorized for detection and/or diagnosis of SARS-CoV-2 by FDA under an Emergency Use Authorization (EUA). This EUA will remain in effect (meaning this test can be used) for the duration of the COVID-19 declaration under Section 564(b)(1) of the Act, 21 U.S.C. section 360bbb-3(b)(1), unless the authorization is terminated or revoked.  Performed at [facility], [address] [place], [place] [address]      Radiology Studies: DG Chest [place] 1 View Result Date: 01/16/2025 EXAM: 2 VIEW(S) XRAY OF THE CHEST 01/15/2025 11:31:43 PM COMPARISON: 12 / 20 / 23. CLINICAL HISTORY: Respiratory distress with wheezes. Shortness of breath. FINDINGS: LUNGS AND PLEURA: Chronic arthritic change. No pleural effusion. No pneumothorax. HEART AND MEDIASTINUM: Atherosclerotic plaque. No acute abnormality of the cardiac and mediastinal  silhouettes. BONES AND SOFT TISSUES: Old healed bilateral rib fractures. IMPRESSION: 1. No acute cardiopulmonary abnormality. 2. Chronic bronchitic change. Electronically signed by: Norman Gatlin MD 01/16/2025 12:39 AM EST RP Workstation: [id number]    Scheduled Meds:  enoxaparin  (LOVENOX ) injection  0.5 mg/kg Subcutaneous Q24H   guaiFENesin   600 mg Oral BID   ipratropium-albuterol   3 mL Nebulization Q6H   methylPREDNISolone  (SOLU-MEDROL ) injection  40 mg Intravenous Q12H   Followed by   NOREEN ON 01/17/2025] predniSONE   40 mg Oral Q breakfast   Continuous Infusions:  [START ON 01/17/2025] cefTRIAXone  (ROCEPHIN )  IV     doxycycline  (VIBRAMYCIN ) IV Stopped (01/16/25 0311)     LOS: 0 days  MDM: Patient is high risk for one or more organ failure.  They necessitate ongoing hospitalization for continued IV therapies and subsequent lab monitoring. Total time spent interpreting labs and vitals, reviewing the medical record, coordinating care amongst consultants and care team members, directly assessing and discussing care with the patient and/or family: 55 min *** Laree Lock, MD [facility]  To contact the attending physician between 7A-7P please use Epic Chat. To contact the covering physician during after hours 7P-7A, please review Amion.  01/16/2025, 10:02 AM   *This document has been created with the assistance of dictation software. Please excuse typographical errors. *   "

## 2025-01-16 NOTE — Progress Notes (Addendum)
 Patient admitted for acute COPD exacerbation and acute on chronic respiratory failure Assessment and plan as below, rest of the plan per H&P today No charge  COPD with acute exacerbation Acute on chronic respiratory failure with hypoxia Was on Bipap on presentation, weaned to 3L [place], at baseline on 2L [place] Continue IV Solu-Medrol  Schedule and as needed nebulized bronchodilators Continue empiric antibiotics Rocephin  and doxycycline  Antitussives, flutter valve Wean Oxygen as able, goal 88-92%  Tobacco use Refused Nicotine pacthes/gums Smoking cessation counseled  Obesity class I Body mass index is 33.54 kg/m. - Outpatient follow up for lifestyle modification and risk factor management  PT eval

## 2025-01-16 NOTE — H&P (Signed)
 " History and Physical    Patient: Vickie Davis FMW:[id number] DOB: Dec 21, 1960 DOA: 01/15/2025 DOS: the patient was seen and examined on 01/16/2025 PCP: System, Provider Not In  Patient coming from: Home  Chief Complaint:  Chief Complaint  Patient presents with   Shortness of Breath    HPI: Vickie Davis is a 64 y.o. female with medical history significant for COPD on home O2 at 2 L being admitted with COPD exacerbation with acute on chronic respiratory failure currently on BiPAP.  Presented by EMS with a several day history of cough and wheezing not improving with home bronchodilators. En route, she received DuoNebs, magnesium and Solu-Medrol  and was placed on CPAP due to increased work of breathing. Upon arrival she was transitioned to BiPAP, was tachycardic and tachypneic and saturating near 100%  VBG on BiPAP with pH 7.25 and pCO2 69.  Labs otherwise notable for glucose 208, hemoglobin 9.2 baseline 10-11, proBNP 161 Respiratory viral panel negative for COVID flu and RSV Chest x-ray with chronic bronchitic change  Patient given additional DuoNebs Started on Rocephin  and doxycycline  due to severity of exacerbation Admission requested      Past Medical History:  Diagnosis Date   COPD (chronic obstructive pulmonary disease) (HCC)    History reviewed. No pertinent surgical history. Social History:  reports that she quit smoking about 2 years ago. Her smoking use included cigarettes. She started smoking about 48 years ago. She has a 23.4 pack-year smoking history. She has never used smokeless tobacco. She reports that she does not currently use alcohol. She reports that she does not use drugs.  Allergies[1]  History reviewed. No pertinent family history.  Prior to Admission medications  Medication Sig Start Date End Date Taking? Authorizing Provider  albuterol  (ACCUNEB ) 1.25 MG/3ML nebulizer solution Take 2 ampules by nebulization every 6 (six) hours as needed for wheezing.   Yes  [provider]  azithromycin  (ZITHROMAX ) 250 MG tablet Take 250 mg by mouth daily. 10/13/24  Yes [provider]  DULERA  200-5 MCG/ACT AERO Inhale 2 puffs into the lungs 2 (two) times daily. 11/01/22  Yes [provider]  fluticasone  (FLONASE ) 50 MCG/ACT nasal spray Place 2 sprays into both nostrils daily. 10/25/22  Yes [provider]  furosemide  (LASIX ) 40 MG tablet Take 40 mg by mouth 2 (two) times daily as needed. 07/24/21  Yes [provider]  ipratropium-albuterol  (DUONEB) 0.5-2.5 (3) MG/3ML SOLN Take 6 mLs by nebulization every 4 (four) hours as needed.   Yes [provider]  traZODone  (DESYREL ) 100 MG tablet Take 100 mg by mouth at bedtime.   Yes [provider]  VENTOLIN  HFA 108 (90 Base) MCG/ACT inhaler Inhale 2 puffs into the lungs every 4 (four) hours as needed for wheezing or shortness of breath.   Yes [provider]  guaiFENesin -dextromethorphan  (ROBITUSSIN DM) 100-10 MG/5ML syrup Take 10 mLs by mouth every 6 (six) hours as needed for cough. Patient not taking: Reported on 01/16/2025 12/09/22   [place]anda [place], MD    Physical Exam: Vitals:   01/15/25 2247 01/15/25 2258 01/15/25 2301 01/16/25 0100  BP:   116/76 118/77  Pulse:  (!) 114  (!) 113  Resp:  (!) 24  19  Temp:  98.2 F (36.8 C)  98 F (36.7 C)  TempSrc:  Oral  Oral  SpO2:   100% 99%  Weight: 94.3 kg     Height: 5' 6 (1.676 m)      Physical Exam Vitals and  nursing note reviewed.  Constitutional:      Comments: Appears comfortable on BiPAP  HENT:     Head: Normocephalic and atraumatic.  Cardiovascular:     Rate and Rhythm: Regular rhythm. Tachycardia present.     Heart sounds: Normal heart sounds.  Pulmonary:     Effort: Tachypnea present.     Breath sounds: Wheezing and rhonchi present.  Abdominal:     Palpations: Abdomen is soft.     Tenderness: There is no abdominal tenderness.  Neurological:     Mental Status: Mental status is at  baseline.     Labs on Admission: I have personally reviewed following labs and imaging studies  CBC: Recent Labs  Lab 01/16/25 0016  WBC 7.9  NEUTROABS 5.4  HGB 9.2*  HCT 29.1*  MCV 99.0  PLT 249   Basic Metabolic Panel: Recent Labs  Lab 01/16/25 0016  NA 140  K 3.6  CL 108  CO2 19*  GLUCOSE 208*  BUN 10  CREATININE 0.62  CALCIUM  7.6*   GFR: Estimated Creatinine Clearance: 81.1 mL/min (by C-G formula based on SCr of 0.62 mg/dL). Liver Function Tests: Recent Labs  Lab 01/16/25 0016  AST 22  ALT 16  ALKPHOS 79  BILITOT 0.4  PROT 6.1*  ALBUMIN 3.7   No results for input(s): LIPASE, AMYLASE in the last 168 hours. No results for input(s): AMMONIA in the last 168 hours. Coagulation Profile: No results for input(s): INR, PROTIME in the last 168 hours. Cardiac Enzymes: No results for input(s): CKTOTAL, CKMB, CKMBINDEX, TROPONINI in the last 168 hours. BNP (last 3 results) Recent Labs    01/16/25 0016  PROBNP 161.0   HbA1C: No results for input(s): HGBA1C in the last 72 hours. CBG: No results for input(s): GLUCAP in the last 168 hours. Lipid Profile: No results for input(s): CHOL, HDL, LDLCALC, TRIG, CHOLHDL, LDLDIRECT in the last 72 hours. Thyroid Function Tests: No results for input(s): TSH, T4TOTAL, FREET4, T3FREE, THYROIDAB in the last 72 hours. Anemia Panel: No results for input(s): VITAMINB12, FOLATE, FERRITIN, TIBC, IRON, RETICCTPCT in the last 72 hours. Urine analysis:    Component Value Date/Time   COLORURINE STRAW (A) 12/05/2022 2002   APPEARANCEUR CLEAR (A) 12/05/2022 2002   LABSPEC 1.005 12/05/2022 2002   PHURINE 6.0 12/05/2022 2002   GLUCOSEU NEGATIVE 12/05/2022 2002   HGBUR MODERATE (A) 12/05/2022 2002   BILIRUBINUR NEGATIVE 12/05/2022 2002   KETONESUR 20 (A) 12/05/2022 2002   PROTEINUR NEGATIVE 12/05/2022 2002   NITRITE NEGATIVE 12/05/2022 2002   LEUKOCYTESUR NEGATIVE 12/05/2022  2002    Radiological Exams on Admission: DG Chest [place] 1 View Result Date: 01/16/2025 EXAM: 2 VIEW(S) XRAY OF THE CHEST 01/15/2025 11:31:43 PM COMPARISON: 12 / 20 / 23. CLINICAL HISTORY: Respiratory distress with wheezes. Shortness of breath. FINDINGS: LUNGS AND PLEURA: Chronic arthritic change. No pleural effusion. No pneumothorax. HEART AND MEDIASTINUM: Atherosclerotic plaque. No acute abnormality of the cardiac and mediastinal silhouettes. BONES AND SOFT TISSUES: Old healed bilateral rib fractures. IMPRESSION: 1. No acute cardiopulmonary abnormality. 2. Chronic bronchitic change. Electronically signed by: Norman Gatlin MD 01/16/2025 12:39 AM EST RP Workstation: [id number]   Data Reviewed for HPI: Relevant notes from primary care and specialist visits, past discharge summaries as available in EHR, including Care Everywhere. Prior diagnostic testing as pertinent to current admission diagnoses Updated medications and problem lists for reconciliation ED course, including vitals, labs, imaging, treatment and response to treatment Triage notes, nursing and pharmacy notes and ED provider's notes Notable  results as noted above in HPI      Assessment and Plan: * COPD with acute exacerbation (HCC) Acute on chronic respiratory failure with hypoxia Patient presents in acute respiratory distress arriving on CPAP, transition to BiPAP on arrival.   VBG with pCO2 69 and normal pH Schedule and as needed nebulized bronchodilators IV steroids Continue antibiotics of Rocephin  and doxycycline  Antitussives, flutter valve Continue BiPAP and wean to baseline O2 requirement as able     DVT prophylaxis: Lovenox   Consults: none  Advance Care Planning:   Code Status: Full Code   Family Communication: none  Disposition Plan: Back to previous home environment  Severity of Illness: The appropriate patient status for this patient is OBSERVATION. Observation status is judged to be reasonable and  necessary in order to provide the required intensity of service to ensure the patient's safety. The patient's presenting symptoms, physical exam findings, and initial radiographic and laboratory data in the context of their medical condition is felt to place them at decreased risk for further clinical deterioration. Furthermore, it is anticipated that the patient will be medically stable for discharge from the hospital within 2 midnights of admission.   Author: Delayne LULLA Solian, MD 01/16/2025 3:20 AM  For on call review [email or web address].      [1]  Allergies Allergen Reactions   Sulfa Antibiotics Hives   Egg Protein-Containing Drug Products Itching    No mayonnaise or salad dressings made with eggs either   Other    "

## 2025-01-16 NOTE — Assessment & Plan Note (Signed)
 Acute on chronic respiratory failure with hypoxia Patient presents in acute respiratory distress arriving on CPAP, transition to BiPAP on arrival.   VBG with pCO2 69 and normal pH Schedule and as needed nebulized bronchodilators IV steroids Continue antibiotics of Rocephin  and doxycycline  Antitussives, flutter valve Continue BiPAP and wean to baseline O2 requirement as able

## 2025-01-16 NOTE — ED Notes (Signed)
 Pt complaining about HA from Bi-PAP facemask. Pt looks well, informed pt we could take the mask off and see how she does. Tolerating RA well, 96%. RT notified.

## 2025-01-16 NOTE — ED Provider Notes (Signed)
 "  [facility] Provider Note    Event Date/Time   First MD Initiated Contact with Patient 01/15/25 2306     (approximate)   History   Shortness of Breath   HPI  Vickie Davis is a 64 y.o. female   Past medical history of COPD here with productive cough, shortness of breath, wheezing, chest tightness.  1 week worsening.  Temporary relief with albuterol  inhalers but despite that quickly gets worse.  Significant trouble breathing today.    No chest pain or GI or GU symptoms.  No other acute medical complaints   Independent Historian contributed to assessment above: EMS reports giving DuoNebs and Solu-Medrol  and route      Physical Exam   Triage Vital Signs: ED Triage Vitals  Encounter Vitals Group     BP 01/15/25 2301 116/76     Girls Systolic BP Percentile --      Girls Diastolic BP Percentile --      Boys Systolic BP Percentile --      Boys Diastolic BP Percentile --      Pulse Rate 01/15/25 2258 (!) 114     Resp 01/15/25 2258 (!) 24     Temp 01/15/25 2258 98.2 F (36.8 C)     Temp Source 01/15/25 2258 Oral     SpO2 01/15/25 2301 100 %     Weight 01/15/25 2247 207 lb 12.8 oz (94.3 kg)     Height 01/15/25 2247 5' 6 (1.676 m)     Head Circumference --      Peak Flow --      Pain Score 01/15/25 2247 0     Pain Loc --      Pain Education --      Exclude from Growth Chart --     Most recent vital signs: Vitals:   01/16/25 0100 01/16/25 0500  BP: 118/77 114/75  Pulse: (!) 113 97  Resp: 19 18  Temp: 98 F (36.7 C) 98.3 F (36.8 C)  SpO2: 99% 97%    General: Awake, no distress.  CV:  Good peripheral perfusion.  Resp:  Normal effort.  Abd:  No distention. Other:  On BiPAP breathing upper comfortably speaking in short phrases with wheezing throughout all lung fields without focality.   ED Results / Procedures / Treatments   Labs (all labs ordered are listed, but only abnormal results are displayed) Labs Reviewed  BLOOD GAS, VENOUS  - Abnormal; Notable for the following components:      Result Value   pCO2, Ven 42 (*)    pO2, Ven 55 (*)    Bicarbonate 19.3 (*)    Acid-base deficit 7.3 (*)    All other components within normal limits  CBC WITH DIFFERENTIAL/PLATELET - Abnormal; Notable for the following components:   RBC 2.94 (*)    Hemoglobin 9.2 (*)    HCT 29.1 (*)    All other components within normal limits  COMPREHENSIVE METABOLIC PANEL WITH GFR - Abnormal; Notable for the following components:   CO2 19 (*)    Glucose, Bld 208 (*)    Calcium  7.6 (*)    Total Protein 6.1 (*)    All other components within normal limits  BLOOD GAS, VENOUS - Abnormal; Notable for the following components:   pCO2, Ven 69 (*)    Bicarbonate 30.3 (*)    All other components within normal limits  RESP PANEL BY RT-PCR (RSV, FLU A&B, COVID)  [id number]  PRO BRAIN  NATRIURETIC PEPTIDE  HIV ANTIBODY (ROUTINE TESTING W REFLEX)     I ordered and reviewed the above labs they are notable for initial pCO2 69  PROCEDURES:  Critical Care performed: Yes, see critical care procedure note(s)  .Critical Care  Performed by: Cyrena Mylar, MD Authorized by: Cyrena Mylar, MD   Critical care provider statement:    Critical care time (minutes):  35   Critical care was time spent personally by me on the following activities:  Development of treatment plan with patient or surrogate, discussions with consultants, evaluation of patient's response to treatment, examination of patient, ordering and review of laboratory studies, ordering and review of radiographic studies, ordering and performing treatments and interventions, pulse oximetry, re-evaluation of patient's condition and review of old charts    MEDICATIONS ORDERED IN ED: Medications  doxycycline  (VIBRAMYCIN ) 100 mg in sodium chloride  0.9 % 250 mL IVPB (0 mg Intravenous Stopped 01/16/25 0311)  enoxaparin  (LOVENOX ) injection 47.5 mg (47.5 mg Subcutaneous Given 01/16/25 0721)  acetaminophen   (TYLENOL ) tablet 650 mg (650 mg Oral Given 01/16/25 0626)    Or  acetaminophen  (TYLENOL ) suppository 650 mg ( Rectal See Alternative 01/16/25 0626)  ondansetron  (ZOFRAN ) tablet 4 mg (has no administration in time range)    Or  ondansetron  (ZOFRAN ) injection 4 mg (has no administration in time range)  methylPREDNISolone  sodium succinate (SOLU-MEDROL ) 40 mg/mL injection 40 mg (40 mg Intravenous Given 01/16/25 0309)    Followed by  predniSONE  (DELTASONE ) tablet 40 mg (has no administration in time range)  ipratropium-albuterol  (DUONEB) 0.5-2.5 (3) MG/3ML nebulizer solution 3 mL (3 mLs Nebulization Given 01/16/25 0722)  albuterol  (PROVENTIL ) (2.5 MG/3ML) 0.083% nebulizer solution 2.5 mg (has no administration in time range)  HYDROcodone -acetaminophen  (NORCO/VICODIN) 5-325 MG per tablet 1-2 tablet (has no administration in time range)  guaiFENesin  (MUCINEX ) 12 hr tablet 600 mg (600 mg Oral Given 01/16/25 0309)  cefTRIAXone  (ROCEPHIN ) 1 g in sodium chloride  0.9 % 100 mL IVPB (has no administration in time range)  cefTRIAXone  (ROCEPHIN ) 1 g in sodium chloride  0.9 % 100 mL IVPB (0 g Intravenous Stopped 01/16/25 0044)  albuterol  (PROVENTIL ) (2.5 MG/3ML) 0.083% nebulizer solution 7.5 mg (7.5 mg Nebulization Given 01/16/25 0021)    External physician / consultants:  I spoke with hospital medicine for admission and regarding care plan for this patient.   IMPRESSION / MDM / ASSESSMENT AND PLAN / ED COURSE  I reviewed the triage vital signs and the nursing notes.                                Patient's presentation is most consistent with acute presentation with potential threat to life or bodily function.  Differential diagnosis includes, but is not limited to, COPD exacerbation, respiratory infection, considered but less likely sepsis, ACS, PE, CHF exacerbation   The patient is on the cardiac monitor to evaluate for evidence of arrhythmia and/or significant heart rate changes.  MDM:    Severe COPD  exacerbation as evidenced by productive cough, wheezing, chest tightness and response to bronchodilator therapy.  Given the severity of her respiratory distress was put on BiPAP with significant improvement.  Will continue with albuterol  nebulizers and BiPAP.  Given severity, and productive sputum, advance COPD, given CAP coverage.  Admission.       FINAL CLINICAL IMPRESSION(S) / ED DIAGNOSES   Final diagnoses:  Acute respiratory failure with hypercapnia (HCC)  COPD exacerbation (HCC)  Respiratory infection  Rx / DC Orders   ED Discharge Orders     None        Note:  This document was prepared using Dragon voice recognition software and may include unintentional dictation errors.    Cyrena Mylar, MD 01/16/25 [phone]  "

## 2025-01-16 NOTE — Progress Notes (Signed)
 Anticoagulation monitoring(Lovenox ):  64 yo female ordered Lovenox  47.5 mg Q24H    Filed Weights   01/15/25 2247  Weight: 94.3 kg (207 lb 12.8 oz)   BMI 33.5    Lab Results  Component Value Date   CREATININE 0.62 01/16/2025   CREATININE 0.77 12/09/2022   CREATININE 0.75 12/08/2022   Estimated Creatinine Clearance: 81.1 mL/min (by C-G formula based on SCr of 0.62 mg/dL). Hemoglobin & Hematocrit     Component Value Date/Time   HGB 9.2 (L) 01/16/2025 0016   HGB 13.7 03/21/2012 2333   HCT 29.1 (L) 01/16/2025 0016   HCT 39.4 03/21/2012 2333     Per Protocol for Patient with estCrcl > 30 ml/min and BMI > 30, will transition to Lovenox  47.5 mg Q24h.

## 2025-01-17 ENCOUNTER — Other Ambulatory Visit: Payer: Self-pay

## 2025-01-17 LAB — BASIC METABOLIC PANEL WITH GFR
Anion gap: 9 (ref 5–15)
BUN: 13 mg/dL (ref 8–23)
CO2: 21 mmol/L — ABNORMAL LOW (ref 22–32)
Calcium: 7.5 mg/dL — ABNORMAL LOW (ref 8.9–10.3)
Chloride: 110 mmol/L (ref 98–111)
Creatinine, Ser: 0.61 mg/dL (ref 0.44–1.00)
GFR, Estimated: >60 mL/min
Glucose, Bld: 110 mg/dL — ABNORMAL HIGH (ref 70–99)
Potassium: 4 mmol/L (ref 3.5–5.1)
Sodium: 141 mmol/L (ref 135–145)

## 2025-01-17 LAB — BLOOD GAS, VENOUS
Bicarbonate: 30.3 mmol/L — AB (ref 20.0–28.0)
O2 Saturation: 24.2 mmol/L (ref 0.0–2.0)
Patient temperature: 37
Patient temperature: 37
pCO2, Ven: 69 mmHg — ABNORMAL HIGH (ref 44–60)
pH, Ven: 7.25 (ref 7.25–7.43)
pO2, Ven: 30.3 mmol/L — AB (ref 32–45)

## 2025-01-17 LAB — MAGNESIUM: Magnesium: 1.8 mg/dL (ref 1.7–2.4)

## 2025-01-17 MED ORDER — SODIUM CHLORIDE 3 % IN NEBU
4.0000 mL | INHALATION_SOLUTION | Freq: Two times a day (BID) | RESPIRATORY_TRACT | Status: DC
  Administered 2025-01-17 – 2025-01-18 (×2): 4 mL via RESPIRATORY_TRACT
  Filled 2025-01-17 (×3): qty 4

## 2025-01-17 MED ORDER — DOXYCYCLINE HYCLATE 100 MG PO TABS
100.0000 mg | ORAL_TABLET | Freq: Two times a day (BID) | ORAL | Status: DC
  Administered 2025-01-17 – 2025-01-18 (×3): 100 mg via ORAL
  Filled 2025-01-17 (×3): qty 1

## 2025-01-17 MED ORDER — IPRATROPIUM BROMIDE 0.02 % IN SOLN
0.5000 mg | Freq: Four times a day (QID) | RESPIRATORY_TRACT | Status: DC
  Administered 2025-01-17 – 2025-01-18 (×6): 0.5 mg via RESPIRATORY_TRACT
  Filled 2025-01-17 (×6): qty 2.5

## 2025-01-17 MED ORDER — METHYLPREDNISOLONE SODIUM SUCC 40 MG IJ SOLR
40.0000 mg | Freq: Two times a day (BID) | INTRAMUSCULAR | Status: DC
  Administered 2025-01-17 – 2025-01-18 (×2): 40 mg via INTRAVENOUS
  Filled 2025-01-17 (×2): qty 1

## 2025-01-17 MED ORDER — LEVALBUTEROL HCL 0.63 MG/3ML IN NEBU
0.6300 mg | INHALATION_SOLUTION | Freq: Four times a day (QID) | RESPIRATORY_TRACT | Status: DC
  Administered 2025-01-17 – 2025-01-18 (×6): 0.63 mg via RESPIRATORY_TRACT
  Filled 2025-01-17 (×7): qty 3

## 2025-01-17 NOTE — Progress Notes (Signed)
 PHARMACIST - PHYSICIAN COMMUNICATION DR:   Jerelene CONCERNING: Antibiotic IV to Oral Route Change Policy  RECOMMENDATION: This patient is receiving doxycycline  by the intravenous route.  Based on criteria approved by the Pharmacy and Therapeutics Committee, the antibiotic(s) is/are being converted to the equivalent oral dose form(s).   DESCRIPTION: These criteria include: Patient being treated for a respiratory tract infection, urinary tract infection, cellulitis or clostridium difficile associated diarrhea if on metronidazole The patient is not neutropenic and does not exhibit a GI malabsorption state The patient is eating (either orally or via tube) and/or has been taking other orally administered medications for a least 24 hours The patient is improving clinically and has a Tmax < 100.5  If you have questions about this conversion, please contact the Pharmacy Department  []   [phone] )  [facility] [x]   [phone] )  [facility] []   [phone] )  [facility] []   [phone] )  [facility] []   [phone] )  [facility]    Lum Mania, PharmD, BCPS

## 2025-01-18 ENCOUNTER — Other Ambulatory Visit: Payer: Self-pay

## 2025-01-18 LAB — CBC
HCT: 33.6 % — ABNORMAL LOW (ref 36.0–46.0)
Hemoglobin: 11.3 g/dL — ABNORMAL LOW (ref 12.0–15.0)
MCH: 31.2 pg (ref 26.0–34.0)
MCHC: 33.6 g/dL (ref 30.0–36.0)
MCV: 92.8 fL (ref 80.0–100.0)
Platelets: 321 10*3/uL (ref 150–400)
RBC: 3.62 MIL/uL — ABNORMAL LOW (ref 3.87–5.11)
RDW: 13.2 % (ref 11.5–15.5)
WBC: 6.7 10*3/uL (ref 4.0–10.5)
nRBC: 0 % (ref 0.0–0.2)

## 2025-01-18 LAB — BASIC METABOLIC PANEL WITH GFR
Anion gap: 10 (ref 5–15)
BUN: 17 mg/dL (ref 8–23)
CO2: 25 mmol/L (ref 22–32)
Calcium: 9.1 mg/dL (ref 8.9–10.3)
Chloride: 103 mmol/L (ref 98–111)
Creatinine, Ser: 0.75 mg/dL (ref 0.44–1.00)
GFR, Estimated: >60 mL/min
Glucose, Bld: 147 mg/dL — ABNORMAL HIGH (ref 70–99)
Potassium: 4.5 mmol/L (ref 3.5–5.1)
Sodium: 138 mmol/L (ref 135–145)

## 2025-01-18 LAB — HIV ANTIBODY (ROUTINE TESTING W REFLEX): HIV Screen 4th Generation wRfx: NONREACTIVE

## 2025-01-18 MED ORDER — PREDNISONE 20 MG PO TABS
40.0000 mg | ORAL_TABLET | Freq: Every day | ORAL | 0 refills | Status: AC
  Filled 2025-01-18: qty 10, 5d supply, fill #0

## 2025-01-18 MED ORDER — DOXYCYCLINE HYCLATE 100 MG PO TABS
100.0000 mg | ORAL_TABLET | Freq: Two times a day (BID) | ORAL | 0 refills | Status: AC
  Filled 2025-01-18: qty 10, 5d supply, fill #0

## 2025-01-18 NOTE — Discharge Summary (Signed)
 " Physician Discharge Summary   Patient: Vickie Davis MRN: [id number] DOB: July 17, 1961  Admit date:     01/15/2025  Discharge date: 01/18/25  Discharge Physician: AIDA CHO   PCP: System, Provider Not In   Recommendations at discharge:   Follow-up with PCP in 1 week  Discharge Diagnoses: Principal Problem:   COPD with acute exacerbation (HCC) Active Problems:   Acute on chronic respiratory failure with hypoxia (HCC)  Resolved Problems:   * No resolved hospital problems. *  Hospital Course:  Vickie Davis is a 64 y.o. female with medical history significant for COPD, chronic hypoxic respiratory failure on oxygen as needed, usually 2 L oxygen but sometimes goes up to 4 L.  She presented to the hospital because of shortness of breath.  She had used her nebulizer at home without any improvement.  EMS had given her additional neb treatment and Solu-Medrol  en route to the ED.   She was admitted to the hospital for COPD exacerbation.  Assessment and Plan:  COPD exacerbation: Improved.  She be discharged on prednisone  and doxycycline . S/p treatment with IV Solu-Medrol , IV ceftriaxone  and doxycycline .   Acute on chronic hypoxic respiratory failure: She is on 3 L/min oxygen via [place].  She said she uses 2 to 4 L oxygen as needed at home. She was previously on BiPAP on admission.   Tobacco use disorder: Counseled to quit smoking.   She feels better and back to baseline.  She is deemed stable for discharge to home today.      Consultants: None Procedures performed: None Disposition: Home Diet recommendation:  Cardiac diet DISCHARGE MEDICATION: Allergies as of 01/18/2025       Reactions   Sulfa Antibiotics Hives   Egg Protein-containing Drug Products Itching   No mayonnaise or salad dressings made with eggs either   Other         Medication List     STOP taking these medications    azithromycin  250 MG tablet Commonly known as: ZITHROMAX     guaiFENesin -dextromethorphan  100-10 MG/5ML syrup Commonly known as: ROBITUSSIN DM       TAKE these medications    doxycycline  100 MG tablet Commonly known as: VIBRA -TABS Take 1 tablet (100 mg total) by mouth every 12 (twelve) hours for 5 days.   Dulera  200-5 MCG/ACT Aero Generic drug: mometasone -formoterol  Inhale 2 puffs into the lungs 2 (two) times daily.   fluticasone  50 MCG/ACT nasal spray Commonly known as: FLONASE  Place 2 sprays into both nostrils daily.   furosemide  40 MG tablet Commonly known as: LASIX  Take 40 mg by mouth 2 (two) times daily as needed.   ipratropium-albuterol  0.5-2.5 (3) MG/3ML Soln Commonly known as: DUONEB Take 6 mLs by nebulization every 4 (four) hours as needed.   predniSONE  20 MG tablet Commonly known as: DELTASONE  Take 2 tablets (40 mg total) by mouth daily for 5 days. Start taking on: January 19, 2025   traZODone  100 MG tablet Commonly known as: DESYREL  Take 100 mg by mouth at bedtime.   Ventolin  HFA 108 (90 Base) MCG/ACT inhaler Generic drug: albuterol  Inhale 2 puffs into the lungs every 4 (four) hours as needed for wheezing or shortness of breath. What changed: Another medication with the same name was removed. Continue taking this medication, and follow the directions you see here.        Discharge Exam: Filed Weights   01/15/25 2247  Weight: 94.3 kg   GEN: NAD SKIN: Warm and dry EYES: No pallor or  icterus ENT: MMM CV: RRR PULM: Good air entry bilaterally, mild expiratory wheezing ABD: soft, ND, NT, +BS CNS: AAO x 3, non focal EXT: No edema or tenderness   Condition at discharge: good  The results of significant diagnostics from this hospitalization (including imaging, microbiology, ancillary and laboratory) are listed below for reference.   Imaging Studies: DG Chest Port 1 View Result Date: 01/16/2025 EXAM: 2 VIEW(S) XRAY OF THE CHEST 01/15/2025 11:31:43 PM COMPARISON: 12 / 20 / 23. CLINICAL HISTORY: Respiratory  distress with wheezes. Shortness of breath. FINDINGS: LUNGS AND PLEURA: Chronic arthritic change. No pleural effusion. No pneumothorax. HEART AND MEDIASTINUM: Atherosclerotic plaque. No acute abnormality of the cardiac and mediastinal silhouettes. BONES AND SOFT TISSUES: Old healed bilateral rib fractures. IMPRESSION: 1. No acute cardiopulmonary abnormality. 2. Chronic bronchitic change. Electronically signed by: Norman Gatlin MD 01/16/2025 12:39 AM EST RP Workstation: [id number]    Microbiology: Results for orders placed or performed during the hospital encounter of 01/15/25  Resp panel by RT-PCR (RSV, Flu A&B, Covid) Anterior Nasal Swab     Status: None   Collection Time: 01/15/25 11:02 PM   Specimen: Anterior Nasal Swab  Result Value Ref Range Status   SARS Coronavirus 2 by RT PCR NEGATIVE NEGATIVE Final    Comment: (NOTE) SARS-CoV-2 target nucleic acids are NOT DETECTED.  The SARS-CoV-2 RNA is generally detectable in upper respiratory specimens during the acute phase of infection. The lowest concentration of SARS-CoV-2 viral copies this assay can detect is 138 copies/mL. A negative result does not preclude SARS-Cov-2 infection and should not be used as the sole basis for treatment or other patient management decisions. A negative result may occur with  improper specimen collection/handling, submission of specimen other than nasopharyngeal swab, presence of viral mutation(s) within the areas targeted by this assay, and inadequate number of viral copies(<138 copies/mL). A negative result must be combined with clinical observations, patient history, and epidemiological information. The expected result is Negative.  Fact Sheet for Patients:  bloggercourse.com  Fact Sheet for Healthcare Providers:  seriousbroker.it  This test is no t yet approved or cleared by the [place]  FDA and  has been authorized for detection and/or  diagnosis of SARS-CoV-2 by FDA under an Emergency Use Authorization (EUA). This EUA will remain  in effect (meaning this test can be used) for the duration of the COVID-19 declaration under Section 564(b)(1) of the Act, 21 U.S.C.section 360bbb-3(b)(1), unless the authorization is terminated  or revoked sooner.       Influenza A by PCR NEGATIVE NEGATIVE Final   Influenza B by PCR NEGATIVE NEGATIVE Final    Comment: (NOTE) The Xpert Xpress SARS-CoV-2/FLU/RSV plus assay is intended as an aid in the diagnosis of influenza from Nasopharyngeal swab specimens and should not be used as a sole basis for treatment. Nasal washings and aspirates are unacceptable for Xpert Xpress SARS-CoV-2/FLU/RSV testing.  Fact Sheet for Patients: bloggercourse.com  Fact Sheet for Healthcare Providers: seriousbroker.it  This test is not yet approved or cleared by the [place]  FDA and has been authorized for detection and/or diagnosis of SARS-CoV-2 by FDA under an Emergency Use Authorization (EUA). This EUA will remain in effect (meaning this test can be used) for the duration of the COVID-19 declaration under Section 564(b)(1) of the Act, 21 U.S.C. section 360bbb-3(b)(1), unless the authorization is terminated or revoked.     Resp Syncytial Virus by PCR NEGATIVE NEGATIVE Final    Comment: (NOTE) Fact Sheet for Patients: bloggercourse.com  Fact Sheet for  Healthcare Providers: seriousbroker.it  This test is not yet approved or cleared by the [place]  FDA and has been authorized for detection and/or diagnosis of SARS-CoV-2 by FDA under an Emergency Use Authorization (EUA). This EUA will remain in effect (meaning this test can be used) for the duration of the COVID-19 declaration under Section 564(b)(1) of the Act, 21 U.S.C. section 360bbb-3(b)(1), unless the authorization is terminated  or revoked.  Performed at [facility], [address] [place], [place] [address]     Labs: CBC: Recent Labs  Lab 01/16/25 0016 01/18/25 0355  WBC 7.9 6.7  NEUTROABS 5.4  --   HGB 9.2* 11.3*  HCT 29.1* 33.6*  MCV 99.0 92.8  PLT 249 321   Basic Metabolic Panel: Recent Labs  Lab 01/16/25 0016 01/17/25 0405 01/18/25 0355  NA 140 141 138  K 3.6 4.0 4.5  CL 108 110 103  CO2 19* 21* 25  GLUCOSE 208* 110* 147*  BUN 10 13 17   CREATININE 0.62 0.61 0.75  CALCIUM  7.6* 7.5* 9.1  MG  --  1.8  --    Liver Function Tests: Recent Labs  Lab 01/16/25 0016  AST 22  ALT 16  ALKPHOS 79  BILITOT 0.4  PROT 6.1*  ALBUMIN 3.7   CBG: No results for input(s): GLUCAP in the last 168 hours.  Discharge time spent: greater than 30 minutes.  Signed: AIDA CHO, MD [facility] 01/18/2025 "

## 2025-01-18 NOTE — Plan of Care (Signed)
  Problem: Clinical Measurements: Goal: Will remain free from infection Outcome: Progressing Goal: Respiratory complications will improve Outcome: Progressing   Problem: Nutrition: Goal: Adequate nutrition will be maintained Outcome: Progressing   

## 2025-01-18 NOTE — TOC CM/SW Note (Addendum)
 Transition of Care [facility]) - Inpatient Brief Assessment   Patient Details  Name: Vickie Davis MRN: [id number] Date of Birth: 06-13-1961  Transition of Care [facility]) CM/SW Contact:    Shasta DELENA Daring, RN Phone Number: 01/18/2025, 9:31 AM   Clinical Narrative:  Transition of Care Department [facility]) has reviewed patient and no TOC needs have been identified at this time.  If new patient transition needs arise, please place a TOC consult.  PCP is [facility]  Transition of Care Asessment: Insurance and Status: Insurance coverage has been reviewed Patient has primary care physician: Yes Home environment has been reviewed: single family home Prior level of function:: independent Prior/Current Home Services: Current home services ([facility].  no HH.) Social Drivers of Health Review: SDOH reviewed no interventions necessary Readmission risk has been reviewed: Yes Transition of care needs: no transition of care needs at this time

## 2025-01-18 NOTE — Plan of Care (Signed)
   Problem: Activity: Goal: Risk for activity intolerance will decrease Outcome: Progressing   Problem: Pain Managment: Goal: General experience of comfort will improve and/or be controlled Outcome: Progressing   Problem: Safety: Goal: Ability to remain free from injury will improve Outcome: Progressing

## 2025-01-18 NOTE — Plan of Care (Signed)
" °  Problem: Education: Goal: Knowledge of General Education information will improve Description: Including pain rating scale, medication(s)/side effects and non-pharmacologic comfort measures Outcome: Adequate for Discharge   Problem: Health Behavior/Discharge Planning: Goal: Ability to manage health-related needs will improve Outcome: Adequate for Discharge   Problem: Clinical Measurements: Goal: Ability to maintain clinical measurements within normal limits will improve Outcome: Adequate for Discharge Goal: Will remain free from infection 01/18/2025 1104 by Freeda Leon POUR, RN Outcome: Adequate for Discharge 01/18/2025 1027 by Freeda Leon POUR, RN Outcome: Progressing Goal: Diagnostic test results will improve Outcome: Adequate for Discharge Goal: Respiratory complications will improve 01/18/2025 1104 by Leondra Cullin K, RN Outcome: Adequate for Discharge 01/18/2025 1027 by Freeda Leon POUR, RN Outcome: Progressing Goal: Cardiovascular complication will be avoided Outcome: Adequate for Discharge   Problem: Activity: Goal: Risk for activity intolerance will decrease Outcome: Adequate for Discharge   Problem: Nutrition: Goal: Adequate nutrition will be maintained 01/18/2025 1104 by Freeda Leon POUR, RN Outcome: Adequate for Discharge 01/18/2025 1027 by Freeda Leon POUR, RN Outcome: Progressing   Problem: Coping: Goal: Level of anxiety will decrease Outcome: Adequate for Discharge   Problem: Elimination: Goal: Will not experience complications related to bowel motility Outcome: Adequate for Discharge Goal: Will not experience complications related to urinary retention Outcome: Adequate for Discharge   Problem: Pain Managment: Goal: General experience of comfort will improve and/or be controlled Outcome: Adequate for Discharge   Problem: Safety: Goal: Ability to remain free from injury will improve Outcome: Adequate for Discharge   Problem: Skin Integrity: Goal: Risk for  impaired skin integrity will decrease Outcome: Adequate for Discharge   Problem: Education: Goal: Knowledge of disease or condition will improve Outcome: Adequate for Discharge Goal: Knowledge of the prescribed therapeutic regimen will improve Outcome: Adequate for Discharge Goal: Individualized Educational Video(s) Outcome: Adequate for Discharge   Problem: Activity: Goal: Ability to tolerate increased activity will improve Outcome: Adequate for Discharge Goal: Will verbalize the importance of balancing activity with adequate rest periods Outcome: Adequate for Discharge   Problem: Respiratory: Goal: Ability to maintain a clear airway will improve Outcome: Adequate for Discharge Goal: Levels of oxygenation will improve Outcome: Adequate for Discharge Goal: Ability to maintain adequate ventilation will improve Outcome: Adequate for Discharge   "
